# Patient Record
Sex: Female | Born: 1955 | Race: White | Hispanic: No | Marital: Married | Smoking: Former smoker
Health system: Southern US, Community
[De-identification: ages and names within clinical notes are randomized; demographics above are authoritative.]

## PROBLEM LIST (undated history)

## (undated) DIAGNOSIS — H269 Unspecified cataract: Secondary | ICD-10-CM

## (undated) DIAGNOSIS — E079 Disorder of thyroid, unspecified: Secondary | ICD-10-CM

## (undated) HISTORY — DX: Unspecified cataract: H26.9

## (undated) HISTORY — PX: COLONOSCOPY: SHX174

## (undated) HISTORY — DX: Disorder of thyroid, unspecified: E07.9

## (undated) HISTORY — PX: POLYPECTOMY: SHX149

---

## 1985-05-02 HISTORY — PX: OTHER SURGICAL HISTORY: SHX169

## 1985-06-15 HISTORY — PX: BREAST CYST EXCISION: SHX579

## 1989-05-02 HISTORY — PX: TUBAL LIGATION: SHX77

## 1993-05-02 HISTORY — PX: CHOLECYSTECTOMY: SHX55

## 1998-08-11 ENCOUNTER — Other Ambulatory Visit: Admission: RE | Admit: 1998-08-11 | Discharge: 1998-08-11 | Payer: Self-pay | Admitting: Obstetrics & Gynecology

## 1999-05-03 HISTORY — PX: LAPAROSCOPIC HYSTERECTOMY: SHX1926

## 1999-08-24 ENCOUNTER — Other Ambulatory Visit: Admission: RE | Admit: 1999-08-24 | Discharge: 1999-08-24 | Payer: Self-pay | Admitting: Obstetrics & Gynecology

## 2000-10-26 ENCOUNTER — Other Ambulatory Visit: Admission: RE | Admit: 2000-10-26 | Discharge: 2000-10-26 | Payer: Self-pay | Admitting: Obstetrics & Gynecology

## 2001-02-28 ENCOUNTER — Inpatient Hospital Stay (HOSPITAL_COMMUNITY): Admission: RE | Admit: 2001-02-28 | Discharge: 2001-03-02 | Payer: Self-pay | Admitting: Obstetrics & Gynecology

## 2013-06-13 ENCOUNTER — Encounter: Payer: Self-pay | Admitting: Internal Medicine

## 2013-07-30 ENCOUNTER — Ambulatory Visit (AMBULATORY_SURGERY_CENTER): Payer: Self-pay

## 2013-07-30 VITALS — Ht 63.0 in | Wt 203.2 lb

## 2013-07-30 DIAGNOSIS — Z1211 Encounter for screening for malignant neoplasm of colon: Secondary | ICD-10-CM

## 2013-07-30 MED ORDER — MOVIPREP 100 G PO SOLR: 1.0000 | Freq: Once | ORAL | Status: DC

## 2013-08-13 ENCOUNTER — Ambulatory Visit (AMBULATORY_SURGERY_CENTER): Payer: Commercial Managed Care - PPO | Admitting: Internal Medicine

## 2013-08-13 ENCOUNTER — Encounter: Payer: Self-pay | Admitting: Internal Medicine

## 2013-08-13 VITALS — BP 142/81 | HR 68 | Temp 97.6°F | Resp 16 | Ht 63.0 in | Wt 203.0 lb

## 2013-08-13 DIAGNOSIS — D126 Benign neoplasm of colon, unspecified: Secondary | ICD-10-CM

## 2013-08-13 DIAGNOSIS — Z1211 Encounter for screening for malignant neoplasm of colon: Secondary | ICD-10-CM

## 2013-08-13 MED ORDER — SODIUM CHLORIDE 0.9 % IV SOLN: 500.0000 mL | INTRAVENOUS | Status: DC

## 2013-08-13 NOTE — Patient Instructions (Signed)
Impressions/recommendations:  Polyp (handout given) High Fiber diet (handout given)  Repeat colonoscopy pending pathology results.  YOU HAD AN ENDOSCOPIC PROCEDURE TODAY AT THE [facility]: Refer to the procedure report that was given to you for any specific questions about what was found during the examination.  If the procedure report does not answer your questions, please call your gastroenterologist to clarify.  If you requested that your care partner not be given the details of your procedure findings, then the procedure report has been included in a sealed envelope for you to review at your convenience later.  YOU SHOULD EXPECT: Some feelings of bloating in the abdomen. Passage of more gas than usual.  Walking can help get rid of the air that was put into your GI tract during the procedure and reduce the bloating. If you had a lower endoscopy (such as a colonoscopy or flexible sigmoidoscopy) you may notice spotting of blood in your stool or on the toilet paper. If you underwent a bowel prep for your procedure, then you may not have a normal bowel movement for a few days.  DIET: Your first meal following the procedure should be a light meal and then it is ok to progress to your normal diet.  A half-sandwich or bowl of soup is an example of a good first meal.  Heavy or fried foods are harder to digest and may make you feel nauseous or bloated.  Likewise meals heavy in dairy and vegetables can cause extra gas to form and this can also increase the bloating.  Drink plenty of fluids but you should avoid alcoholic beverages for 24 hours.  ACTIVITY: Your care partner should take you home directly after the procedure.  You should plan to take it easy, moving slowly for the rest of the day.  You can resume normal activity the day after the procedure however you should NOT DRIVE or use heavy machinery for 24 hours (because of the sedation medicines used during the test).    SYMPTOMS TO REPORT  IMMEDIATELY: A gastroenterologist can be reached at any hour.  During normal business hours, 8:30 AM to 5:00 PM Monday through Friday, call [phone].  After hours and on weekends, please call the GI answering service at [phone] who will take a message and have the physician on call contact you.   Following lower endoscopy (colonoscopy or flexible sigmoidoscopy):  Excessive amounts of blood in the stool  Significant tenderness or worsening of abdominal pains  Swelling of the abdomen that is new, acute  Fever of 100F or higher   FOLLOW UP: If any biopsies were taken you will be contacted by phone or by letter within the next 1-3 weeks.  Call your gastroenterologist if you have not heard about the biopsies in 3 weeks.  Our staff will call the home number listed on your records the next business day following your procedure to check on you and address any questions or concerns that you may have at that time regarding the information given to you following your procedure. This is a courtesy call and so if there is no answer at the home number and we have not heard from you through the emergency physician on call, we will assume that you have returned to your regular daily activities without incident.  SIGNATURES/CONFIDENTIALITY: You and/or your care partner have signed paperwork which will be entered into your electronic medical record.  These signatures attest to the fact that that the information above on   After Visit Summary has been reviewed and is understood.  Full responsibility of the confidentiality of this discharge information lies with you and/or your care-partner.

## 2013-08-13 NOTE — Op Note (Addendum)
[place]  [address] [place], [id number]   COLONOSCOPY PROCEDURE REPORT  PATIENT: Tracey, Patel  MR#: [id number] BIRTHDATE: 08-04-1955 , 48  yrs. old GENDER: Female ENDOSCOPIST: Lafayette Dragon, MD REFERRED RF:FMBWGY Stann Mainland, M.D. PROCEDURE DATE:  08/13/2013 PROCEDURE:   Colonoscopy with cold biopsy polypectomy First Screening Colonoscopy - Avg.  risk and is 50 yrs.  old or older Yes.  Prior Negative Screening - Now for repeat screening. N/A  History of Adenoma - Now for follow-up colonoscopy & has been > or = to 3 yrs.  N/A  Polyps Removed Today? Yes. ASA CLASS:   Class I INDICATIONS:Average risk patient for colon cancer. MEDICATIONS: MAC sedation, administered by CRNA and Propofol (Diprivan) 280 mg IV  DESCRIPTION OF PROCEDURE:   After the risks benefits and alternatives of the procedure were thoroughly explained, informed consent was obtained.  A digital rectal exam revealed no abnormalities of the rectum.   The LB [id number]  endoscope was introduced through the anus and advanced to the cecum, which was identified by both the appendix and ileocecal valve. No adverse events experienced.   The quality of the prep was good, using MoviPrep  The instrument was then slowly withdrawn as the colon was fully examined.      COLON FINDINGS: A diminutive sessile polyp was found in the sigmoid colon.  A polypectomy was performed with cold forceps.  The resection was complete and the polyp tissue was completely retrieved.  Retroflexed views revealed no abnormalities. The time to cecum=3 minutes 34 seconds.  Withdrawal time=9 minutes 05 seconds.  The scope was withdrawn and the procedure completed. COMPLICATIONS: There were no complications.  ENDOSCOPIC IMPRESSION: Diminutive sessile polyp was found in the sigmoid colon; polypectomy was performed with cold forceps RECOMMENDATIONS: await pathology report High fiber diet Recall colonoscopy pending path  report  eSigned:  Lafayette Dragon, MD 08/13/2013 11:17 AM Revised: 08/13/2013 11:17 AM  cc:

## 2013-08-13 NOTE — Progress Notes (Signed)
Called to room to assist during endoscopic procedure.  Patient ID and intended procedure confirmed with present staff. Received instructions for my participation in the procedure from the performing physician.  

## 2013-08-13 NOTE — Progress Notes (Signed)
Report to pacu rn, vss, bbs=clear 

## 2013-08-14 ENCOUNTER — Telehealth: Payer: Self-pay | Admitting: *Deleted

## 2013-08-14 NOTE — Telephone Encounter (Signed)
  Follow up Call-  Call back number 08/13/2013  Post procedure Call Back phone  # [phone] hm  Permission to leave phone message No     Patient questions:  Do you have a fever, pain , or abdominal swelling? no Pain Score  0 *  Have you tolerated food without any problems? yes  Have you been able to return to your normal activities? yes  Do you have any questions about your discharge instructions: Diet   no Medications  no Follow up visit  no  Do you have questions or concerns about your Care? no  Actions: * If pain score is 4 or above: No action needed, pain <4.

## 2013-08-19 ENCOUNTER — Encounter: Payer: Self-pay | Admitting: Internal Medicine

## 2013-08-21 ENCOUNTER — Encounter: Payer: Self-pay | Admitting: *Deleted

## 2015-08-24 ENCOUNTER — Encounter: Payer: Self-pay | Admitting: Family Medicine

## 2016-05-03 LAB — HM MAMMOGRAPHY

## 2016-11-08 DIAGNOSIS — Z124 Encounter for screening for malignant neoplasm of cervix: Secondary | ICD-10-CM | POA: Insufficient documentation

## 2016-11-08 DIAGNOSIS — Z0000 Encounter for general adult medical examination without abnormal findings: Secondary | ICD-10-CM | POA: Insufficient documentation

## 2016-11-08 LAB — HEPATIC FUNCTION PANEL
ALK PHOS: 80 (ref 25–125)
ALT: 16 (ref 7–35)
AST: 19 (ref 13–35)
Bilirubin, Total: 0.7

## 2016-11-08 LAB — HEMOGLOBIN A1C: Hemoglobin A1C: 5.3

## 2016-11-08 LAB — BASIC METABOLIC PANEL
BUN: 13 (ref 4–21)
Creatinine: 1 (ref 0.5–1.1)
GLUCOSE: 95
Potassium: 4.9 (ref 3.4–5.3)
SODIUM: 139 (ref 137–147)

## 2016-11-08 LAB — CBC AND DIFFERENTIAL
HCT: 39 (ref 36–46)
HEMOGLOBIN: 13 (ref 12.0–16.0)
Neutrophils Absolute: 6
PLATELETS: 347 (ref 150–399)
WBC: 9.5

## 2016-11-08 LAB — LIPID PANEL
Cholesterol: 183 (ref 0–200)
HDL: 47 (ref 35–70)
Triglycerides: 160 (ref 40–160)

## 2016-11-08 LAB — TSH: TSH: 1.9 (ref 0.41–5.90)

## 2016-11-11 LAB — HM PAP SMEAR: HM PAP: NEGATIVE

## 2018-02-07 ENCOUNTER — Encounter: Payer: Self-pay | Admitting: Family Medicine

## 2018-02-07 ENCOUNTER — Ambulatory Visit: Payer: No Typology Code available for payment source | Admitting: Family Medicine

## 2018-02-07 VITALS — BP 130/72 | HR 70 | Temp 97.7°F | Ht 63.0 in | Wt 202.0 lb

## 2018-02-07 DIAGNOSIS — E039 Hypothyroidism, unspecified: Secondary | ICD-10-CM | POA: Diagnosis not present

## 2018-02-07 DIAGNOSIS — E669 Obesity, unspecified: Secondary | ICD-10-CM | POA: Diagnosis not present

## 2018-02-07 DIAGNOSIS — Z6835 Body mass index (BMI) 35.0-35.9, adult: Secondary | ICD-10-CM | POA: Diagnosis not present

## 2018-02-07 DIAGNOSIS — E6609 Other obesity due to excess calories: Secondary | ICD-10-CM | POA: Insufficient documentation

## 2018-02-07 NOTE — Patient Instructions (Addendum)
It was very nice to see you today!  You are doing an awesome job!  Keep up the good work!  No medication changes today.   I would like to get your records from your OBGYN.  I will see you back in 1 year, or sooner as needed.   Take care, Dr Jerline Pain   Preventive Care 40-64 Years, Female Preventive care refers to lifestyle choices and visits with your health care provider that can promote health and wellness. What does preventive care include?  A yearly physical exam. This is also called an annual well check.  Dental exams once or twice a year.  Routine eye exams. Ask your health care provider how often you should have your eyes checked.  Personal lifestyle choices, including: ? Daily care of your teeth and gums. ? Regular physical activity. ? Eating a healthy diet. ? Avoiding tobacco and drug use. ? Limiting alcohol use. ? Practicing safe sex. ? Taking low-dose aspirin daily starting at age 42. ? Taking vitamin and mineral supplements as recommended by your health care provider. What happens during an annual well check? The services and screenings done by your health care provider during your annual well check will depend on your age, overall health, lifestyle risk factors, and family history of disease. Counseling Your health care provider may ask you questions about your:  Alcohol use.  Tobacco use.  Drug use.  Emotional well-being.  Home and relationship well-being.  Sexual activity.  Eating habits.  Work and work Statistician.  Method of birth control.  Menstrual cycle.  Pregnancy history.  Screening You may have the following tests or measurements:  Height, weight, and BMI.  Blood pressure.  Lipid and cholesterol levels. These may be checked every 5 years, or more frequently if you are over 38 years old.  Skin check.  Lung cancer screening. You may have this screening every year starting at age 71 if you have a 30-pack-year history of smoking  and currently smoke or have quit within the past 15 years.  Fecal occult blood test (FOBT) of the stool. You may have this test every year starting at age 67.  Flexible sigmoidoscopy or colonoscopy. You may have a sigmoidoscopy every 5 years or a colonoscopy every 10 years starting at age 61.  Hepatitis C blood test.  Hepatitis B blood test.  Sexually transmitted disease (STD) testing.  Diabetes screening. This is done by checking your blood sugar (glucose) after you have not eaten for a while (fasting). You may have this done every 1-3 years.  Mammogram. This may be done every 1-2 years. Talk to your health care provider about when you should start having regular mammograms. This may depend on whether you have a family history of breast cancer.  BRCA-related cancer screening. This may be done if you have a family history of breast, ovarian, tubal, or peritoneal cancers.  Pelvic exam and Pap test. This may be done every 3 years starting at age 83. Starting at age 73, this may be done every 5 years if you have a Pap test in combination with an HPV test.  Bone density scan. This is done to screen for osteoporosis. You may have this scan if you are at high risk for osteoporosis.  Discuss your test results, treatment options, and if necessary, the need for more tests with your health care provider. Vaccines Your health care provider may recommend certain vaccines, such as:  Influenza vaccine. This is recommended every year.  Tetanus, diphtheria,  and acellular pertussis (Tdap, Td) vaccine. You may need a Td booster every 10 years.  Varicella vaccine. You may need this if you have not been vaccinated.  Zoster vaccine. You may need this after age 74.  Measles, mumps, and rubella (MMR) vaccine. You may need at least one dose of MMR if you were born in 1957 or later. You may also need a second dose.  Pneumococcal 13-valent conjugate (PCV13) vaccine. You may need this if you have certain  conditions and were not previously vaccinated.  Pneumococcal polysaccharide (PPSV23) vaccine. You may need one or two doses if you smoke cigarettes or if you have certain conditions.  Meningococcal vaccine. You may need this if you have certain conditions.  Hepatitis A vaccine. You may need this if you have certain conditions or if you travel or work in places where you may be exposed to hepatitis A.  Hepatitis B vaccine. You may need this if you have certain conditions or if you travel or work in places where you may be exposed to hepatitis B.  Haemophilus influenzae type b (Hib) vaccine. You may need this if you have certain conditions.  Talk to your health care provider about which screenings and vaccines you need and how often you need them. This information is not intended to replace advice given to you by your health care provider. Make sure you discuss any questions you have with your health care provider. Document Released: 05/15/2015 Document Revised: 01/06/2016 Document Reviewed: 02/17/2015 Elsevier Interactive Patient Education  2018 [place] Years, Female Preventive care refers to lifestyle choices and visits with your health care provider that can promote health and wellness. What does preventive care include?  A yearly physical exam. This is also called an annual well check.  Dental exams once or twice a year.  Routine eye exams. Ask your health care provider how often you should have your eyes checked.  Personal lifestyle choices, including: ? Daily care of your teeth and gums. ? Regular physical activity. ? Eating a healthy diet. ? Avoiding tobacco and drug use. ? Limiting alcohol use. ? Practicing safe sex. ? Taking low-dose aspirin daily starting at age 63. ? Taking vitamin and mineral supplements as recommended by your health care provider. What happens during an annual well check? The services and screenings done by your health  care provider during your annual well check will depend on your age, overall health, lifestyle risk factors, and family history of disease. Counseling Your health care provider may ask you questions about your:  Alcohol use.  Tobacco use.  Drug use.  Emotional well-being.  Home and relationship well-being.  Sexual activity.  Eating habits.  Work and work Statistician.  Method of birth control.  Menstrual cycle.  Pregnancy history.  Screening You may have the following tests or measurements:  Height, weight, and BMI.  Blood pressure.  Lipid and cholesterol levels. These may be checked every 5 years, or more frequently if you are over 15 years old.  Skin check.  Lung cancer screening. You may have this screening every year starting at age 76 if you have a 30-pack-year history of smoking and currently smoke or have quit within the past 15 years.  Fecal occult blood test (FOBT) of the stool. You may have this test every year starting at age 42.  Flexible sigmoidoscopy or colonoscopy. You may have a sigmoidoscopy every 5 years or a colonoscopy every 10 years starting at age 11.  Hepatitis C blood test.  Hepatitis B blood test.  Sexually transmitted disease (STD) testing.  Diabetes screening. This is done by checking your blood sugar (glucose) after you have not eaten for a while (fasting). You may have this done every 1-3 years.  Mammogram. This may be done every 1-2 years. Talk to your health care provider about when you should start having regular mammograms. This may depend on whether you have a family history of breast cancer.  BRCA-related cancer screening. This may be done if you have a family history of breast, ovarian, tubal, or peritoneal cancers.  Pelvic exam and Pap test. This may be done every 3 years starting at age 72. Starting at age 81, this may be done every 5 years if you have a Pap test in combination with an HPV test.  Bone density scan. This is  done to screen for osteoporosis. You may have this scan if you are at high risk for osteoporosis.  Discuss your test results, treatment options, and if necessary, the need for more tests with your health care provider. Vaccines Your health care provider may recommend certain vaccines, such as:  Influenza vaccine. This is recommended every year.  Tetanus, diphtheria, and acellular pertussis (Tdap, Td) vaccine. You may need a Td booster every 10 years.  Varicella vaccine. You may need this if you have not been vaccinated.  Zoster vaccine. You may need this after age 36.  Measles, mumps, and rubella (MMR) vaccine. You may need at least one dose of MMR if you were born in 1957 or later. You may also need a second dose.  Pneumococcal 13-valent conjugate (PCV13) vaccine. You may need this if you have certain conditions and were not previously vaccinated.  Pneumococcal polysaccharide (PPSV23) vaccine. You may need one or two doses if you smoke cigarettes or if you have certain conditions.  Meningococcal vaccine. You may need this if you have certain conditions.  Hepatitis A vaccine. You may need this if you have certain conditions or if you travel or work in places where you may be exposed to hepatitis A.  Hepatitis B vaccine. You may need this if you have certain conditions or if you travel or work in places where you may be exposed to hepatitis B.  Haemophilus influenzae type b (Hib) vaccine. You may need this if you have certain conditions.  Talk to your health care provider about which screenings and vaccines you need and how often you need them. This information is not intended to replace advice given to you by your health care provider. Make sure you discuss any questions you have with your health care provider. Document Released: 05/15/2015 Document Revised: 01/06/2016 Document Reviewed: 02/17/2015 Elsevier Interactive Patient Education  [employer].

## 2018-02-07 NOTE — Assessment & Plan Note (Signed)
Well-controlled on Synthroid 175 mcg daily.  Obtain records from her OB/GYN regarding most recent TSH.

## 2018-02-07 NOTE — Assessment & Plan Note (Signed)
BMI 35.8 today.  Discussed lifestyle modifications.  Congratulated patient on recent weight loss.  Follow-up in 1 year.

## 2018-02-07 NOTE — Progress Notes (Signed)
Subjective:  Tracey Patel is a 62 y.o. female who presents today with a chief complaint of hypothyroidism and to establish care.   HPI:  Hypothyroidism, chronic problem, new to provider Diagnosed with a teenager.  He has been on Synthroid for several years.  She is currently on 175 mcg daily and tolerating well.  No skin or hair changes.  No constipation or diarrhea.  Synthroid helps with her symptoms. Her symptoms are stable on her current dose of synthroid.  Obesity, chronic problem, new to provider Several year history.  She has made several dietary modifications lately and has lost about 12 pounds in the past 6 weeks.  She likes to walk.  Her weight is improving.  ROS: Per HPI, otherwise a complete review of systems was negative.   PMH:  The following were reviewed and entered/updated in epic: Past Medical History:  Diagnosis Date  . Thyroid disease    hypothyroidism   Patient Active Problem List   Diagnosis Date Noted  . Hypothyroidism 02/07/2018  . Class 2 obesity without serious comorbidity with body mass index (BMI) of 35.0 to 35.9 in adult 02/07/2018   Past Surgical History:  Procedure Laterality Date  . breast cyst removal  1987  . CHOLECYSTECTOMY  1995  . LAPAROSCOPIC HYSTERECTOMY  2001  . TUBAL LIGATION  1991    Family History  Problem Relation Age of Onset  . Diabetes Mother   . Atrial fibrillation Mother   . Congestive Heart Failure Mother   . Obesity Mother   . High blood pressure Father   . Atrial fibrillation Father   . Gout Father   . Barrett's esophagus Father   . Esophageal cancer Maternal Grandfather   . Lung cancer Maternal Grandfather   . Uterine cancer Paternal Grandmother   . Colon cancer Neg Hx   . Pancreatic cancer Neg Hx   . Rectal cancer Neg Hx   . Stomach cancer Neg Hx     Medications- reviewed and updated Current Outpatient Medications  Medication Sig Dispense Refill  . levothyroxine (SYNTHROID, LEVOTHROID) 175 MCG tablet  Take 175 mcg by mouth daily before breakfast.      No current facility-administered medications for this visit.     Allergies-reviewed and updated No Known Allergies  Social History   Socioeconomic History  . Marital status: Married    Spouse name: Not on file  . Number of children: Not on file  . Years of education: Not on file  . Highest education level: Not on file  Occupational History  . Not on file  Social Needs  . Financial resource strain: Not on file  . Food insecurity:    Worry: Not on file    Inability: Not on file  . Transportation needs:    Medical: Not on file    Non-medical: Not on file  Tobacco Use  . Smoking status: Former Smoker    Types: Cigarettes  . Smokeless tobacco: Never Used  . Tobacco comment: Smoked less than pack for 2 years  Substance and Sexual Activity  . Alcohol use: Yes    Alcohol/week: 4.0 - 5.0 standard drinks    Types: 4 - 5 Glasses of wine per week  . Drug use: No  . Sexual activity: Yes    Partners: Male  Lifestyle  . Physical activity:    Days per week: Not on file    Minutes per session: Not on file  . Stress: Not on file  Relationships  .  Social connections:    Talks on phone: Not on file    Gets together: Not on file    Attends religious service: Not on file    Active member of club or organization: Not on file    Attends meetings of clubs or organizations: Not on file    Relationship status: Not on file  Other Topics Concern  . Not on file  Social History Narrative  . Not on file    Objective:  Physical Exam: BP 130/72 (BP Location: Left Arm, Patient Position: Sitting, Cuff Size: Normal)   Pulse 70   Temp 97.7 F (36.5 C) (Oral)   Ht 5\' 3"  (1.6 m)   Wt 202 lb (91.6 kg)   SpO2 98%   BMI 35.78 kg/m   Gen: NAD, resting comfortably CV: RRR with no murmurs appreciated Pulm: NWOB, CTAB with no crackles, wheezes, or rhonchi GI: Obese, normal bowel sounds present. Soft, Nontender, Nondistended. MSK: No edema,  cyanosis, or clubbing noted Skin: Warm, dry Neuro: Grossly normal, moves all extremities Psych: Normal affect and thought content  Assessment/Plan:  Hypothyroidism Well-controlled on Synthroid 175 mcg daily.  Obtain records from her OB/GYN regarding most recent TSH.  Class 2 obesity without serious comorbidity with body mass index (BMI) of 35.0 to 35.9 in adult BMI 35.8 today.  Discussed lifestyle modifications.  Congratulated patient on recent weight loss.  Follow-up in 1 year.  Preventative Healthcare Obtain records from her OBGYN regarding recent mammogram, Pap smear, and blood work.  Algis Greenhouse. Jerline Pain, MD 02/07/2018 10:02 AM

## 2018-02-09 ENCOUNTER — Encounter: Payer: Self-pay | Admitting: Physical Therapy

## 2018-08-14 ENCOUNTER — Encounter: Payer: Self-pay | Admitting: Gastroenterology

## 2018-12-19 DIAGNOSIS — N951 Menopausal and female climacteric states: Secondary | ICD-10-CM | POA: Insufficient documentation

## 2018-12-20 ENCOUNTER — Ambulatory Visit (INDEPENDENT_AMBULATORY_CARE_PROVIDER_SITE_OTHER): Payer: No Typology Code available for payment source | Admitting: Sports Medicine

## 2018-12-20 ENCOUNTER — Other Ambulatory Visit: Payer: Self-pay | Admitting: Sports Medicine

## 2018-12-20 ENCOUNTER — Other Ambulatory Visit: Payer: Self-pay

## 2018-12-20 ENCOUNTER — Ambulatory Visit (INDEPENDENT_AMBULATORY_CARE_PROVIDER_SITE_OTHER): Payer: No Typology Code available for payment source

## 2018-12-20 ENCOUNTER — Encounter: Payer: Self-pay | Admitting: Sports Medicine

## 2018-12-20 VITALS — Temp 96.9°F | Resp 16

## 2018-12-20 DIAGNOSIS — M79672 Pain in left foot: Secondary | ICD-10-CM

## 2018-12-20 DIAGNOSIS — M722 Plantar fascial fibromatosis: Secondary | ICD-10-CM

## 2018-12-20 DIAGNOSIS — M7732 Calcaneal spur, left foot: Secondary | ICD-10-CM | POA: Diagnosis not present

## 2018-12-20 MED ORDER — TRIAMCINOLONE ACETONIDE 10 MG/ML IJ SUSP
10.0000 mg | Freq: Once | INTRAMUSCULAR | Status: AC
  Administered 2018-12-20: 10 mg

## 2018-12-20 MED ORDER — MELOXICAM 15 MG PO TABS: 15.0000 mg | ORAL_TABLET | Freq: Every day | ORAL | 0 refills | Status: DC

## 2018-12-20 NOTE — Patient Instructions (Signed)

## 2018-12-20 NOTE — Progress Notes (Signed)
Subjective: Tracey Patel is a 63 y.o. female patient presents to office with complaint of moderate heel pain on the left. Patient admits to post static dyskinesia for 1 month in duration.  Pain is worse when getting out of bed in the morning.  Patient ranks pain 8-9 out of 10 at the bottom of the heel with a low-grade stabbing that after she gets going rest throughout the day that is 3 out of 10.  Patient has treated this problem with motrin and icing with no relief.  Denies swelling, warmth, redness.  Denies any other pedal complaints.   Review of Systems  All other systems reviewed and are negative.    Patient Active Problem List   Diagnosis Date Noted  . Menopausal symptom 12/19/2018  . Hypothyroidism 02/07/2018  . Class 2 obesity without serious comorbidity with body mass index (BMI) of 35.0 to 35.9 in adult 02/07/2018  . Screening for malignant neoplasm of cervix 11/08/2016    Current Outpatient Medications on File Prior to Visit  Medication Sig Dispense Refill  . levothyroxine (SYNTHROID, LEVOTHROID) 175 MCG tablet Take 175 mcg by mouth daily before breakfast.      No current facility-administered medications on file prior to visit.     No Known Allergies  Objective: Physical Exam General: The patient is alert and oriented x3 in no acute distress.  Dermatology: Skin is warm, dry and supple bilateral lower extremities. Nails 1-10 are normal. There is no erythema, edema, no eccymosis, no open lesions present. Integument is otherwise unremarkable.  Vascular: Dorsalis Pedis pulse and Posterior Tibial pulse are 2/4 bilateral. Capillary fill time is immediate to all digits.  Neurological: Grossly intact to light touch with an achilles reflex of +2/5 and a  negative Tinel's sign bilateral.  Musculoskeletal: Tenderness to palpation at the medial calcaneal tubercale and through the insertion of the plantar fascia on the left foot. No pain with compression of calcaneus bilateral. No  pain with tuning fork to calcaneus bilateral. No pain with calf compression bilateral. There is decreased Ankle joint range of motion bilateral. All other joints range of motion within normal limits bilateral. Strength 5/5 in all groups bilateral.   Gait: Unassisted, Antalgic avoid weight on left heel  Xray,Left foot:  Normal osseous mineralization. Joint spaces preserved. No fracture/dislocation/boney destruction. Calcaneal spur present with mild thickening of plantar fascia. No other soft tissue abnormalities or radiopaque foreign bodies.   Assessment and Plan: Problem List Items Addressed This Visit    None    Visit Diagnoses    Plantar fasciitis of left foot    -  Primary   Relevant Medications   triamcinolone acetonide (KENALOG) 10 MG/ML injection 10 mg (Completed) (Start on 12/20/2018  9:30 AM)   meloxicam (MOBIC) 15 MG tablet   Heel spur, left       Inflammatory pain of left heel          -Complete examination performed.  -Xrays reviewed -Discussed with patient in detail the condition of plantar fasciitis, how this occurs and general treatment options. Explained both conservative and surgical treatments.  -After oral consent and aseptic prep, injected a mixture containing 1 ml of 2% plain lidocaine, 1 ml 0.5% plain marcaine, 0.5 ml of kenalog 10 and 0.5 ml of dexamethasone phosphate into left heel. Post-injection care discussed with patient.  -Rx Meloxicam  -Recommended good supportive shoes - Explained in detail the use of the fascial brace for the left which was dispensed at today's visit. -Explained and  dispensed to patient daily stretching exercises. -Recommend patient to ice affected area 1-2x daily. -Patient to return to office in 3-4 weeks for follow up or sooner if problems or questions arise.  Landis Martins, DPM

## 2018-12-21 ENCOUNTER — Ambulatory Visit: Payer: Self-pay | Admitting: Family Medicine

## 2018-12-21 ENCOUNTER — Telehealth: Payer: No Typology Code available for payment source | Admitting: Family Medicine

## 2018-12-31 ENCOUNTER — Other Ambulatory Visit: Payer: Self-pay | Admitting: Sports Medicine

## 2018-12-31 DIAGNOSIS — M79672 Pain in left foot: Secondary | ICD-10-CM

## 2018-12-31 DIAGNOSIS — M722 Plantar fascial fibromatosis: Secondary | ICD-10-CM

## 2019-01-17 ENCOUNTER — Encounter: Payer: Self-pay | Admitting: Sports Medicine

## 2019-01-17 ENCOUNTER — Ambulatory Visit (INDEPENDENT_AMBULATORY_CARE_PROVIDER_SITE_OTHER): Payer: No Typology Code available for payment source | Admitting: Sports Medicine

## 2019-01-17 ENCOUNTER — Other Ambulatory Visit: Payer: Self-pay

## 2019-01-17 DIAGNOSIS — M722 Plantar fascial fibromatosis: Secondary | ICD-10-CM

## 2019-01-17 DIAGNOSIS — M79672 Pain in left foot: Secondary | ICD-10-CM

## 2019-01-17 DIAGNOSIS — M7732 Calcaneal spur, left foot: Secondary | ICD-10-CM

## 2019-01-17 NOTE — Progress Notes (Signed)
Subjective: Tracey Patel is a 63 y.o. female returns to office for follow up evaluation after Left heel injection for plantar fasciitis, injection #1 administered 3 weeks ago. Patient states that the injection seems to help her pain; feels 95% better. Patient denies any recent changes in medications or new problems since last visit.   Patient Active Problem List   Diagnosis Date Noted  . Menopausal symptom 12/19/2018  . Hypothyroidism 02/07/2018  . Class 2 obesity without serious comorbidity with body mass index (BMI) of 35.0 to 35.9 in adult 02/07/2018  . Screening for malignant neoplasm of cervix 11/08/2016    Current Outpatient Medications on File Prior to Visit  Medication Sig Dispense Refill  . levothyroxine (SYNTHROID, LEVOTHROID) 175 MCG tablet Take 175 mcg by mouth daily before breakfast.     . meloxicam (MOBIC) 15 MG tablet Take 1 tablet (15 mg total) by mouth daily. 30 tablet 0   No current facility-administered medications on file prior to visit.     No Known Allergies  Objective:   General:  Alert and oriented x 3, in no acute distress  Dermatology: Skin is warm, dry, and supple bilateral. Nails are within normal limits. There is no lower extremity erythema, no eccymosis, no open lesions present bilateral.   Vascular: Dorsalis Pedis and Posterior Tibial pedal pulses are 2/4 bilateral. + hair growth noted bilateral. Capillary Fill Time is 3 seconds in all digits. No varicosities, No edema bilateral lower extremities.   Neurological: Sensation grossly intact to light touch with an achilles reflex of +2 and a  negative Tinel's sign bilateral. Vibratory, sharp/dull, Semmes Weinstein Monofilament within normal limits.   Musculoskeletal: There is no reproducible tenderness to palpation at the medial calcaneal tubercale and through the insertion of the plantar fascia on the Left foot. No pain with compression to calcaneus or application of tuning fork. There is decreased Ankle  joint range of motion bilateral. All other jointsrange of motion  within normal limits bilateral. Strength 5/5 bilateral.   Assessment and Plan: Problem List Items Addressed This Visit    None    Visit Diagnoses    Plantar fasciitis of left foot    -  Primary   Heel spur, left       Inflammatory pain of left heel         -Complete examination performed.  -Previous x-rays reviewed. -Re-discussed with patient in detail the condition of plantar fasciitis, how this  occurs related to the foot type of the patient and general treatment options. -Continue with fascial brace for 1 more week  -Continue with Mobic until completed -Continue with stretching, icing, good supportive shoes daily  -Discussed long term care and reocurrence; will closely monitor; if fails to improve will consider other treatment modalities.  -Patient to return to office PRN or sooner if problems or questions arise.  Landis Martins, DPM

## 2019-07-22 LAB — CBC AND DIFFERENTIAL
HCT: 39 (ref 36–46)
Hemoglobin: 13.5 (ref 12.0–16.0)
Neutrophils Absolute: 51
Platelets: 307 (ref 150–399)
WBC: 9.3

## 2019-07-22 LAB — BASIC METABOLIC PANEL
BUN: 14 (ref 4–21)
CO2: 21 (ref 13–22)
Chloride: 102 (ref 99–108)
Creatinine: 0.9 (ref 0.5–1.1)
Glucose: 102
Potassium: 4.7 (ref 3.4–5.3)
Sodium: 141 (ref 137–147)

## 2019-07-22 LAB — LIPID PANEL
Cholesterol: 194 (ref 0–200)
HDL: 39 (ref 35–70)
LDL Cholesterol: 121
Triglycerides: 192 — AB (ref 40–160)

## 2019-07-22 LAB — COMPREHENSIVE METABOLIC PANEL
Albumin: 4.6 (ref 3.5–5.0)
Calcium: 9.9 (ref 8.7–10.7)
GFR calc Af Amer: 83
GFR calc non Af Amer: 72
Globulin: 2.5

## 2019-07-22 LAB — HEPATIC FUNCTION PANEL
ALT: 28 (ref 7–35)
AST: 22 (ref 13–35)
Alkaline Phosphatase: 104 (ref 25–125)
Bilirubin, Total: 0.6

## 2019-07-22 LAB — TSH: TSH: 3.27 (ref 0.41–5.90)

## 2019-07-22 LAB — CBC: RBC: 4.28 (ref 3.87–5.11)

## 2019-07-22 LAB — HEMOGLOBIN A1C: Hemoglobin A1C: 5.6

## 2019-12-10 ENCOUNTER — Telehealth: Payer: Self-pay | Admitting: Gastroenterology

## 2019-12-10 NOTE — Telephone Encounter (Signed)
Spoke to patient who had a few questions regarding her colonoscopy. Patient is scheduled for pre visit and colonoscopy next month all questions answered. Patient voiced understanding.

## 2019-12-10 NOTE — Telephone Encounter (Signed)
Patient calling to confirm that she is due for a colonoscopy recall is overdue.

## 2020-01-23 ENCOUNTER — Ambulatory Visit (AMBULATORY_SURGERY_CENTER): Payer: Self-pay | Admitting: *Deleted

## 2020-01-23 ENCOUNTER — Other Ambulatory Visit: Payer: Self-pay

## 2020-01-23 VITALS — Ht 63.0 in | Wt 220.0 lb

## 2020-01-23 DIAGNOSIS — Z86010 Personal history of colonic polyps: Secondary | ICD-10-CM

## 2020-01-23 MED ORDER — SUTAB 1479-225-188 MG PO TABS: 24.0000 | ORAL_TABLET | ORAL | 0 refills | Status: DC

## 2020-01-23 NOTE — Progress Notes (Signed)

## 2020-01-24 ENCOUNTER — Encounter: Payer: Self-pay | Admitting: Gastroenterology

## 2020-01-28 ENCOUNTER — Other Ambulatory Visit: Payer: Self-pay

## 2020-01-28 ENCOUNTER — Ambulatory Visit (INDEPENDENT_AMBULATORY_CARE_PROVIDER_SITE_OTHER): Payer: PRIVATE HEALTH INSURANCE | Admitting: Family Medicine

## 2020-01-28 ENCOUNTER — Encounter: Payer: Self-pay | Admitting: Family Medicine

## 2020-01-28 VITALS — BP 133/79 | HR 94 | Temp 98.2°F | Ht 63.0 in | Wt 219.0 lb

## 2020-01-28 DIAGNOSIS — F439 Reaction to severe stress, unspecified: Secondary | ICD-10-CM | POA: Diagnosis not present

## 2020-01-28 DIAGNOSIS — E669 Obesity, unspecified: Secondary | ICD-10-CM | POA: Diagnosis not present

## 2020-01-28 DIAGNOSIS — E039 Hypothyroidism, unspecified: Secondary | ICD-10-CM | POA: Diagnosis not present

## 2020-01-28 NOTE — Assessment & Plan Note (Signed)
Last TSH at goal.  Continue Synthroid 175 mcg daily.

## 2020-01-28 NOTE — Patient Instructions (Signed)
It was very nice to see you today!  I will place a referral for you to see a nutritionist.  Please let me know if you would like to try Ozempic.  Take care, Dr Jerline Pain  Please try these tips to maintain a healthy lifestyle:   Eat at least 3 REAL meals and 1-2 snacks per day.  Aim for no more than 5 hours between eating.  If you eat breakfast, please do so within one hour of getting up.    Each meal should contain half fruits/vegetables, one quarter protein, and one quarter carbs (no bigger than a computer mouse)   Cut down on sweet beverages. This includes juice, soda, and sweet tea.     Drink at least 1 glass of water with each meal and aim for at least 8 glasses per day   Exercise at least 150 minutes every week.

## 2020-01-28 NOTE — Assessment & Plan Note (Signed)
Discussed diet and exercise.  Also discussed pharmacotherapy.  She will look into starting Ozempic.  Will place referral to nutritionist.

## 2020-01-28 NOTE — Assessment & Plan Note (Signed)
Has been under increased stress lately due to home situation.  Overall thinks things are manageable.  Does not want medication at this point.  Gave behavioral health handout with contact information for therapy.

## 2020-01-28 NOTE — Progress Notes (Signed)
   Tracey Patel is a 64 y.o. female who presents today for an office visit.  Assessment/Plan:  Chronic Problems Addressed Today: Stress Has been under increased stress lately due to home situation.  Overall thinks things are manageable.  Does not want medication at this point.  Gave behavioral health handout with contact information for therapy.  Obesity Discussed diet and exercise.  Also discussed pharmacotherapy.  She will look into starting Ozempic.  Will place referral to nutritionist.  Hypothyroidism Last TSH at goal.  Continue Synthroid 175 mcg daily.     Subjective:  HPI:  See A/p.        Objective:  Physical Exam: BP 133/79   Pulse 94   Temp 98.2 F (36.8 C) (Temporal)   Ht 5\' 3"  (1.6 m)   Wt 219 lb (99.3 kg)   SpO2 99%   BMI 38.79 kg/m   Wt Readings from Last 3 Encounters:  01/28/20 219 lb (99.3 kg)  01/23/20 220 lb (99.8 kg)  02/07/18 202 lb (91.6 kg)    Gen: No acute distress, resting comfortably CV: Regular rate and rhythm with no murmurs appreciated Pulm: Normal work of breathing, clear to auscultation bilaterally with no crackles, wheezes, or rhonchi Neuro: Grossly normal, moves all extremities Psych: Normal affect and thought content      Vanda Waskey M. Jerline Pain, MD 01/28/2020 3:47 PM

## 2020-01-31 ENCOUNTER — Encounter: Payer: Self-pay | Admitting: Family Medicine

## 2020-02-06 ENCOUNTER — Encounter: Payer: Self-pay | Admitting: Gastroenterology

## 2020-02-06 ENCOUNTER — Other Ambulatory Visit: Payer: Self-pay | Admitting: Gastroenterology

## 2020-02-06 ENCOUNTER — Other Ambulatory Visit: Payer: Self-pay

## 2020-02-06 ENCOUNTER — Ambulatory Visit (AMBULATORY_SURGERY_CENTER): Payer: PRIVATE HEALTH INSURANCE | Admitting: Gastroenterology

## 2020-02-06 VITALS — BP 139/73 | HR 78 | Temp 97.3°F | Resp 17 | Ht 63.0 in | Wt 220.0 lb

## 2020-02-06 DIAGNOSIS — Z86010 Personal history of colonic polyps: Secondary | ICD-10-CM

## 2020-02-06 DIAGNOSIS — D125 Benign neoplasm of sigmoid colon: Secondary | ICD-10-CM

## 2020-02-06 DIAGNOSIS — K640 First degree hemorrhoids: Secondary | ICD-10-CM

## 2020-02-06 MED ORDER — SODIUM CHLORIDE 0.9 % IV SOLN: 500.0000 mL | INTRAVENOUS | Status: DC

## 2020-02-06 NOTE — Progress Notes (Signed)
Called to room to assist during endoscopic procedure.  Patient ID and intended procedure confirmed with present staff. Received instructions for my participation in the procedure from the performing physician.  

## 2020-02-06 NOTE — Patient Instructions (Addendum)
Handouts given for high fiber diet, hemorrhoids, polyps and hemorrhoid banding.  YOU HAD AN ENDOSCOPIC PROCEDURE TODAY AT [facility]:   Refer to the procedure report that was given to you for any specific questions about what was found during the examination.  If the procedure report does not answer your questions, please call your gastroenterologist to clarify.  If you requested that your care partner not be given the details of your procedure findings, then the procedure report has been included in a sealed envelope for you to review at your convenience later.  YOU SHOULD EXPECT: Some feelings of bloating in the abdomen. Passage of more gas than usual.  Walking can help get rid of the air that was put into your GI tract during the procedure and reduce the bloating. If you had a lower endoscopy (such as a colonoscopy or flexible sigmoidoscopy) you may notice spotting of blood in your stool or on the toilet paper. If you underwent a bowel prep for your procedure, you may not have a normal bowel movement for a few days.  Please Note:  You might notice some irritation and congestion in your nose or some drainage.  This is from the oxygen used during your procedure.  There is no need for concern and it should clear up in a day or so.  SYMPTOMS TO REPORT IMMEDIATELY:   Following lower endoscopy (colonoscopy or flexible sigmoidoscopy):  Excessive amounts of blood in the stool  Significant tenderness or worsening of abdominal pains  Swelling of the abdomen that is new, acute  Fever of 100F or higher  For urgent or emergent issues, a gastroenterologist can be reached at any hour by calling [phone]. Do not use MyChart messaging for urgent concerns.    DIET:  We do recommend a small meal at first, but then you may proceed to your regular diet.  Drink plenty of fluids but you should avoid alcoholic beverages for 24 hours.  ACTIVITY:  You should plan to take it easy for the rest  of today and you should NOT DRIVE or use heavy machinery until tomorrow (because of the sedation medicines used during the test).    FOLLOW UP: Our staff will call the number listed on your records 48-72 hours following your procedure to check on you and address any questions or concerns that you may have regarding the information given to you following your procedure. If we do not reach you, we will leave a message.  We will attempt to reach you two times.  During this call, we will ask if you have developed any symptoms of COVID 19. If you develop any symptoms (ie: fever, flu-like symptoms, shortness of breath, cough etc.) before then, please call [phone].  If you test positive for Covid 19 in the 2 weeks post procedure, please call and report this information to [place].    If any biopsies were taken you will be contacted by phone or by letter within the next 1-3 weeks.  Please call us at [phone] if you have not heard about the biopsies in 3 weeks.    SIGNATURES/CONFIDENTIALITY: You and/or your care partner have signed paperwork which will be entered into your electronic medical record.  These signatures attest to the fact that that the information above on your After Visit Summary has been reviewed and is understood.  Full responsibility of the confidentiality of this discharge information lies with you and/or your care-partner.

## 2020-02-06 NOTE — Progress Notes (Signed)
To PACU, VSS. Report to [email or web address] 

## 2020-02-06 NOTE — Progress Notes (Signed)
Pt's states no medical or surgical changes since previsit or office visit. 

## 2020-02-06 NOTE — Op Note (Signed)
[facility] Patient Name: Tracey Patel Procedure Date: 02/06/2020 8:00 AM MRN: [id number] Endoscopist: Gerrit Heck , MD Age: 64 Referring MD:  Date of Birth: 1955/09/18 Gender: Female Account #: [id number] Procedure:                Colonoscopy Indications:              Surveillance: Personal history of adenomatous                            polyps on last colonoscopy > 5 years ago                           Colonoscopy in 07/2013 with a single subcentimeter                            tubular adenoma, with recommendation to repeat in 5                            years. Medicines:                Monitored Anesthesia Care Procedure:                Pre-Anesthesia Assessment:                           - Prior to the procedure, a History and Physical                            was performed, and patient medications and                            allergies were reviewed. The patient's tolerance of                            previous anesthesia was also reviewed. The risks                            and benefits of the procedure and the sedation                            options and risks were discussed with the patient.                            All questions were answered, and informed consent                            was obtained. Prior Anticoagulants: The patient has                            taken no previous anticoagulant or antiplatelet                            agents. ASA Grade Assessment: II - A patient with  mild systemic disease. After reviewing the risks                            and benefits, the patient was deemed in                            satisfactory condition to undergo the procedure.                           After obtaining informed consent, the colonoscope                            was passed under direct vision. Throughout the                            procedure, the patient's blood pressure, pulse, and                             oxygen saturations were monitored continuously. The                            Colonoscope was introduced through the anus and                            advanced to the the terminal ileum. The colonoscopy                            was performed without difficulty. The patient                            tolerated the procedure well. The quality of the                            bowel preparation was excellent. The terminal                            ileum, ileocecal valve, appendiceal orifice, and                            rectum were photographed. Scope In: 8:04:01 AM Scope Out: 8:19:26 AM Scope Withdrawal Time: 0 hours 10 minutes 24 seconds  Total Procedure Duration: 0 hours 15 minutes 25 seconds  Findings:                 The perianal and digital rectal examinations were                            normal.                           A 5 mm polyp was found in the sigmoid colon. The                            polyp was sessile. The polyp was removed with a  cold snare. Resection and retrieval were complete.                            Estimated blood loss was minimal.                           Non-bleeding internal hemorrhoids were found during                            retroflexion. The hemorrhoids were small and Grade                            I (internal hemorrhoids that do not prolapse).                           The exam was otherwise normal throughout the                            remainder of the colon.                           The terminal ileum appeared normal. Complications:            No immediate complications. Estimated Blood Loss:     Estimated blood loss was minimal. Impression:               - One 5 mm polyp in the sigmoid colon, removed with                            a cold snare. Resected and retrieved.                           - Non-bleeding internal hemorrhoids.                           - The examined portion of the ileum was  normal. Recommendation:           - Patient has a contact number available for                            emergencies. The signs and symptoms of potential                            delayed complications were discussed with the                            patient. Return to normal activities tomorrow.                            Written discharge instructions were provided to the                            patient.                           - Resume previous diet.                           -  Continue present medications.                           - Await pathology results.                           - Repeat colonoscopy for surveillance based on                            pathology results.                           - Return to GI office PRN.                           - Use fiber, for example Citrucel, Fibercon, Konsyl                            or Metamucil.                           - Internal hemorrhoids were noted on this study and                            may be amenable to hemorrhoid band ligation. If you                            are interested in further treatment of these                            hemorrhoids with band ligation, please contact my                            clinic to set up an appointment for evaluation and                            treatment. Gerrit Heck, MD 02/06/2020 8:29:33 AM

## 2020-02-10 ENCOUNTER — Telehealth: Payer: Self-pay | Admitting: *Deleted

## 2020-02-10 NOTE — Telephone Encounter (Signed)
°  Follow up Call-  Call back number 02/06/2020  Post procedure Call Back phone  # [phone]  Permission to leave phone message Yes  Some recent data might be hidden     Patient questions:  Do you have a fever, pain , or abdominal swelling? No. Pain Score  0 *  Have you tolerated food without any problems? Yes.    Have you been able to return to your normal activities? Yes.    Do you have any questions about your discharge instructions: Diet   No. Medications  No. Follow up visit  No.  Do you have questions or concerns about your Care? No.  Actions: * If pain score is 4 or above: No action needed, pain <4. 1. Have you developed a fever since your procedure? no  2.   Have you had an respiratory symptoms (SOB or cough) since your procedure? no  3.   Have you tested positive for COVID 19 since your procedure no  4.   Have you had any family members/close contacts diagnosed with the COVID 19 since your procedure?  no   If yes to any of these questions please route to Joylene John, RN and Joella Prince, RN

## 2020-02-11 ENCOUNTER — Encounter: Payer: Self-pay | Admitting: Family Medicine

## 2020-02-12 ENCOUNTER — Encounter: Payer: Self-pay | Admitting: Gastroenterology

## 2020-02-27 ENCOUNTER — Ambulatory Visit: Payer: PRIVATE HEALTH INSURANCE | Admitting: Dietician

## 2020-12-31 HISTORY — PX: COLONOSCOPY: SHX174

## 2021-02-01 ENCOUNTER — Other Ambulatory Visit: Payer: Self-pay | Admitting: Obstetrics & Gynecology

## 2021-02-01 DIAGNOSIS — Z1231 Encounter for screening mammogram for malignant neoplasm of breast: Secondary | ICD-10-CM

## 2021-02-24 ENCOUNTER — Ambulatory Visit
Admission: RE | Admit: 2021-02-24 | Discharge: 2021-02-24 | Disposition: A | Discharge status: Home / Self Care | Payer: Self-pay | Source: Ambulatory Visit | Attending: Obstetrics & Gynecology | Admitting: Obstetrics & Gynecology

## 2021-02-24 ENCOUNTER — Other Ambulatory Visit: Payer: Self-pay

## 2021-02-24 DIAGNOSIS — Z1231 Encounter for screening mammogram for malignant neoplasm of breast: Secondary | ICD-10-CM

## 2021-03-23 ENCOUNTER — Telehealth: Payer: Self-pay | Admitting: Sports Medicine

## 2021-03-23 NOTE — Telephone Encounter (Signed)
Pt left message stating she has seen [place] a few years ago for Plantar fascitis and now she is having trouble with the other foot. She has some questions about possibly getting orthotics.  I returned call and left message that pt will probably need an appt and to call me back and we can discuss further.

## 2021-03-24 NOTE — Telephone Encounter (Signed)
Pt called back and her insurance is for catastrophic coverage only so she would be self pay. She did not want an appt to see the doctor. She is aware that as of now there is no appts with the orthotics dept in [place] office and if she wanted to proceed she will call me to get scheduled to possibly come to [facility] office. She is aware of the 438.00 cost and I also gave her the information that if she were to get a second pair within 6 months of ordering them 1st pair they are half price.

## 2021-03-24 NOTE — Telephone Encounter (Signed)
Pt returned call yesterday afternoon.  I returned call and left message for pt to call to schedule an appt in [place] to see Dr Cannon Kettle for orthotics and foot pain.

## 2021-06-02 ENCOUNTER — Encounter: Payer: Self-pay | Admitting: Sports Medicine

## 2021-06-02 ENCOUNTER — Ambulatory Visit (INDEPENDENT_AMBULATORY_CARE_PROVIDER_SITE_OTHER): Payer: No Typology Code available for payment source

## 2021-06-02 ENCOUNTER — Ambulatory Visit (INDEPENDENT_AMBULATORY_CARE_PROVIDER_SITE_OTHER): Payer: No Typology Code available for payment source | Admitting: Sports Medicine

## 2021-06-02 DIAGNOSIS — M79671 Pain in right foot: Secondary | ICD-10-CM

## 2021-06-02 DIAGNOSIS — M216X1 Other acquired deformities of right foot: Secondary | ICD-10-CM | POA: Diagnosis not present

## 2021-06-02 DIAGNOSIS — M722 Plantar fascial fibromatosis: Secondary | ICD-10-CM

## 2021-06-02 MED ORDER — MELOXICAM 15 MG PO TABS: 15.0000 mg | ORAL_TABLET | Freq: Every day | ORAL | 0 refills | Status: DC

## 2021-06-02 MED ORDER — TRIAMCINOLONE ACETONIDE 10 MG/ML IJ SUSP
10.0000 mg | Freq: Once | INTRAMUSCULAR | Status: AC
  Administered 2021-06-02: 10 mg

## 2021-06-02 NOTE — Progress Notes (Signed)
Subjective: Tracey Patel is a 66 y.o. female patient presents to office with complaint of moderate heel pain on the right for the last 6 months.  Patient reports that the brace helps but still has significant pain with first few steps in the morning and when she gets up from sitting states that she has been consistent with stretching icing refraining from walking barefooted and sometimes using heat as well for the pain. Denies any other pedal complaints.   Patient Active Problem List   Diagnosis Date Noted   Stress 01/28/2020   Menopausal symptom 12/19/2018   Hypothyroidism 02/07/2018   Obesity 02/07/2018    Current Outpatient Medications on File Prior to Visit  Medication Sig Dispense Refill   levothyroxine (SYNTHROID, LEVOTHROID) 175 MCG tablet Take 175 mcg by mouth daily before breakfast.      No current facility-administered medications on file prior to visit.    No Known Allergies  Objective: Physical Exam General: The patient is alert and oriented x3 in no acute distress.  Dermatology: Skin is warm, dry and supple bilateral lower extremities. Nails 1-10 are normal. There is no erythema, edema, no eccymosis, no open lesions present. Integument is otherwise unremarkable.  Vascular: Dorsalis Pedis pulse and Posterior Tibial pulse are 2/4 bilateral. Capillary fill time is immediate to all digits.  Neurological: Grossly intact to light touch bilateral.  Musculoskeletal: Tenderness to palpation at the medial calcaneal tubercale and through the insertion of the plantar fascia on the right foot, no pain with compression of calcaneus bilateral.  There is mild likely compensation pain to the fifth ray of the right foot.  No pain with calf compression bilateral. There is decreased Ankle joint range of motion bilateral. All other joints range of motion within normal limits bilateral. Strength 5/5 in all groups bilateral.   Gait: Unassisted, Antalgic avoid weight on left/right heel  Xray  right foot: Normal osseous mineralization. Joint spaces preserved. No fracture/dislocation/boney destruction.  Posterior and inferior calcaneal spur present with mild thickening of plantar fascia. No other soft tissue abnormalities or radiopaque foreign bodies.   Assessment and Plan: Problem List Items Addressed This Visit   None Visit Diagnoses     Plantar fasciitis of right foot    -  Primary   Relevant Medications   triamcinolone acetonide (KENALOG) 10 MG/ML injection 10 mg (Start on 06/02/2021  1:00 PM)   Other Relevant Orders   DG Foot Complete Right   Pain of right heel       Acquired equinus deformity of right foot           -Complete examination performed.  -Xrays reviewed -Discussed with patient in detail the condition of plantar fasciitis and equinus, how this occurs and general treatment options. Explained both conservative and surgical treatments.  -After oral consent and aseptic prep, injected a mixture containing 1 ml of 2% plain lidocaine, 1 ml 0.5% plain marcaine, 0.5 ml of kenalog 10 and 0.5 ml of dexamethasone phosphate into left/right heel. Post-injection care discussed with patient.  -Rx Meloxicam for pain and inflammation - Explained in detail the use of the night splint to assist with passive stretching of the Achilles and plantar fascia on the right which was dispensed at today's visit. -Recommend continue with good supportive shoes and plantar fascial brace which she already owns -Re-explained  to patient daily stretching exercises. -Recommend patient to ice affected area 1-2x daily and may alternate with heat as tolerated. -Discussed with patient the importance of prevention including stretching  even once pain has resolved good supportive shoes and orthotics; copy of shoe list provided -Patient to return to office in 4 weeks for follow up or sooner if problems or questions arise.  Landis Martins, DPM

## 2021-06-02 NOTE — Patient Instructions (Signed)
Shoe List For tennis shoes recommend:  Fairlawn New balance Saucony HOKA Can be purchased at [employer] sports or [employer] arch fit Can be purchased at any major retailers  Vionic  SAS Can be purchased at [employer] or [employer]   For work shoes recommend: [employer] Work [employer] Can be purchased at a variety of places or [employer]   For casual shoes recommend: Oofos Can be purchased at [employer] sports or [employer]  Can be purchased at [employer] or [place] recommend: Power Steps Can be purchased in office/[facility] [employer] Can be purchased at [employer] sports or [employer] Can be purchased at [place] recommend: Leisure centre manager at [employer] and [employer]

## 2021-06-28 ENCOUNTER — Telehealth: Payer: Self-pay | Admitting: Family Medicine

## 2021-06-28 NOTE — Telephone Encounter (Signed)
Pt is requesting to transfer care from Dr Jerline Pain to Dr Gena Fray because [facility] is closer for her and her husband

## 2021-06-28 NOTE — Telephone Encounter (Signed)
[place] with me.   Algis Greenhouse. Jerline Pain, MD 06/28/2021 1:13 PM

## 2021-06-29 NOTE — Telephone Encounter (Signed)
Called and lvm for pt to call back and schedule

## 2021-07-02 ENCOUNTER — Ambulatory Visit: Payer: No Typology Code available for payment source | Admitting: Sports Medicine

## 2021-11-30 ENCOUNTER — Ambulatory Visit (INDEPENDENT_AMBULATORY_CARE_PROVIDER_SITE_OTHER): Payer: PRIVATE HEALTH INSURANCE | Admitting: Family Medicine

## 2021-11-30 ENCOUNTER — Encounter: Payer: Self-pay | Admitting: Family Medicine

## 2021-11-30 VITALS — BP 124/78 | HR 76 | Temp 97.1°F | Ht 64.0 in | Wt 218.8 lb

## 2021-11-30 DIAGNOSIS — M654 Radial styloid tenosynovitis [de Quervain]: Secondary | ICD-10-CM

## 2021-11-30 DIAGNOSIS — E039 Hypothyroidism, unspecified: Secondary | ICD-10-CM | POA: Diagnosis not present

## 2021-11-30 LAB — TSH: TSH: 1.01 u[IU]/mL (ref 0.35–5.50)

## 2021-11-30 NOTE — Progress Notes (Signed)
[facility] [address] [place] [address] Dept: [phone] Dept Fax: [phone]  Transfer of Care Office Visit  Subjective:    Patient ID: Tracey Patel, female    DOB: 10-Jan-1956, 66 y.o..   MRN: [id number]  Chief Complaint  Patient presents with   Establish Care    [facility]- establish care.  C/o havning Rt wrist pain off/on x 1 month.  Has used Ibuprofen, Ic, and brace.   Fasting today.     History of Present Illness:  Patient is in today to establish care. Tracey Patel was born in [place], [place]. She attended college at [employer] and later [employer], majoring in nursing. She met her future husband at [employer] and moved to [facility] in 1986. They have now been married for 36 years. She has no children. She retired 8 years ago from [employer] where she was the [employer] [place] [employer]. She denies any tobacco or drug use. She drinsk alcohol 3-4 times a week.  Tracey Patel has a history of hypothyroidism since she was 66 years old. She is currently manage don levothyroxine 175 mcg daily.  Tracey Patel notes a 61-monthhistory of left wrist pain. She thought this might be carpal tunnel syndrome, but denies any numbness and her pain has been more lateral at the wrist. She admits that there is increased pain when lifting items, like cooking pans. She has tried icing this and using ibuprofen without resolution.  Past Medical History: Patient Active Problem List   Diagnosis Date Noted   Hypothyroidism 02/07/2018   Obesity 02/07/2018   Past Surgical History:  Procedure Laterality Date   BREAST CYST EXCISION Right 06/15/1985   CHOLECYSTECTOMY  1995   COLONOSCOPY  12/2020   repeat in 7 yrs   LAPAROSCOPIC HYSTERECTOMY  2001   POLYPECTOMY     TUBAL LIGATION  1991   Family History  Problem Relation Age of Onset   Heart disease Mother    Diabetes Mother    Atrial fibrillation Mother    Obesity Mother    Barrett's esophagus  Mother    Hypertension Father    Atrial fibrillation Father    Gout Father    Barrett's esophagus Father    Dementia Father    Cancer Sister        Uterine   Cancer Brother        Leukemia   Atrial fibrillation Maternal Grandmother    Heart disease Maternal Grandmother    Cancer Maternal Grandfather        Esophageal   Esophageal cancer Maternal Grandfather    Cancer Paternal Grandmother        Uterine   Heart disease Paternal Grandfather    Colon cancer Neg Hx    Pancreatic cancer Neg Hx    Rectal cancer Neg Hx    Stomach cancer Neg Hx    Colon polyps Neg Hx    Breast cancer Neg Hx    Outpatient Medications Prior to Visit  Medication Sig Dispense Refill   levothyroxine (SYNTHROID, LEVOTHROID) 175 MCG tablet Take 175 mcg by mouth daily before breakfast.      meloxicam (MOBIC) 15 MG tablet Take 1 tablet (15 mg total) by mouth daily. 30 tablet 0   No facility-administered medications prior to visit.   No Known Allergies    Objective:   Today's Vitals   11/30/21 0817  BP: 124/78  Pulse: 76  Temp: (!) 97.1 F (36.2 C)  TempSrc: Temporal  SpO2: 95%  Weight: 218 lb 12.8 oz (99.2 kg)  Height: _0  (1.626 m)   Body mass index is 37.56 kg/m.   General: Well developed, well nourished. No acute distress. Extremities: Full ROM. There is swelling noted over the radial aspect of the left wrist with mild tenderness to   palpation. Finkelstein's test + Psych: Alert and oriented. Normal mood and affect.  Health Maintenance Due  Topic Date Due   HIV Screening  Never done   Hepatitis C Screening  Never done   TETANUS/TDAP  Never done   Zoster Vaccines- Shingrix (1 of 2) Never done   INFLUENZA VACCINE  11/30/2021     Assessment & Plan:   1. Hypothyroidism, unspecified type I will recheck her TSH today.She will continue her levothyroxine 175 mcg daily for now. She will be transferring her prescription to me when her current meds run out.  - TSH  2. Tennis Must Quervain's  tenosynovitis, left Tracey Patel's exam is consistent with a tenosynovitis of the left wrist. She has already failed conservative management. I will refer her to Dr. Raeford Razor to see about fitting a thumb spica splint and considering a steroid injection.  - Ambulatory referral to Sports Medicine   Return in about 6 months (around 06/02/2022) for Reassessment.   Haydee Salter, MD

## 2021-12-07 ENCOUNTER — Ambulatory Visit: Payer: Self-pay

## 2021-12-07 ENCOUNTER — Encounter: Payer: Self-pay | Admitting: Family Medicine

## 2021-12-07 ENCOUNTER — Ambulatory Visit (INDEPENDENT_AMBULATORY_CARE_PROVIDER_SITE_OTHER): Payer: PRIVATE HEALTH INSURANCE | Admitting: Family Medicine

## 2021-12-07 VITALS — BP 128/80 | Ht 64.0 in | Wt 218.0 lb

## 2021-12-07 DIAGNOSIS — M654 Radial styloid tenosynovitis [de Quervain]: Secondary | ICD-10-CM | POA: Diagnosis not present

## 2021-12-07 DIAGNOSIS — M25532 Pain in left wrist: Secondary | ICD-10-CM

## 2021-12-07 NOTE — Progress Notes (Signed)
  Tracey Patel - 66 y.o. female MRN [id number]  Date of birth: 1956-02-01  SUBJECTIVE:  Including CC & ROS.  No chief complaint on file.   Tracey Patel is a 66 y.o. female that is presenting with left wrist pain.  The pain is present for a few weeks.  Pain is occurring at the wrist.  No specific inciting event or trigger.  Does get some relief with ibuprofen.  No history of surgery.   Review of Systems See HPI   HISTORY: Past Medical, Surgical, Social, and Family History Reviewed & Updated per EMR.   Pertinent Historical Findings include:  Past Medical History:  Diagnosis Date   Cataract    forming - small    Thyroid disease    hypothyroidism    Past Surgical History:  Procedure Laterality Date   BREAST CYST EXCISION Right 06/15/1985   CHOLECYSTECTOMY  1995   COLONOSCOPY  12/2020   repeat in 7 yrs   LAPAROSCOPIC HYSTERECTOMY  2001   POLYPECTOMY     TUBAL LIGATION  1991     PHYSICAL EXAM:  VS: BP 128/80 (BP Location: Left Arm, Patient Position: Sitting)   Ht '5\' 4"'$  (1.626 m)   Wt 218 lb (98.9 kg)   BMI 37.42 kg/m  Physical Exam Gen: NAD, alert, cooperative with exam, well-appearing MSK:  Neurovascularly intact    Limited ultrasound: Left wrist:  Normal-appearing CMC joint. Significant effusion within the first dorsal compartment. Normal-appearing second dorsal compartment  Summary: De Quervain's tenosynovitis  Ultrasound and interpretation by Clearance Coots, MD   Aspiration/Injection Procedure Note Tracey Patel February 28, 1956  Procedure: Injection Indications: Left wrist pain  Procedure Details Consent: Risks of procedure as well as the alternatives and risks of each were explained to the (patient/caregiver).  Consent for procedure obtained. Time Out: Verified patient identification, verified procedure, site/side was marked, verified correct patient position, special equipment/implants available, medications/allergies/relevent history reviewed, required  imaging and test results available.  Performed.  The area was cleaned with iodine and alcohol swabs.    The left first dorsal compartment was injected using 1 cc's of 6 mg betamethasone and 1 cc's of 0.25% bupivacaine with a 25 1 1/2" needle.  Ultrasound was used. Images were obtained in long views showing the injection.     A sterile dressing was applied.  Patient did tolerate procedure well.    ASSESSMENT & PLAN:   De Quervain's tenosynovitis, left Acutely occurring.  She does do a fair amount of work with her hands. -Counseled on home exercise therapy and supportive care. -Injection today. -Counseled on compression. -Could consider lab work.

## 2021-12-07 NOTE — Patient Instructions (Signed)
Nice to meet you Please use ice as needed Please consider compression  Please try the exercises   Please send me a message in MyChart with any questions or updates.  Please see me back in 4 weeks.   --Dr. Raeford Razor

## 2021-12-07 NOTE — Assessment & Plan Note (Signed)
Acutely occurring.  She does do a fair amount of work with her hands. -Counseled on home exercise therapy and supportive care. -Injection today. -Counseled on compression. -Could consider lab work.

## 2021-12-10 ENCOUNTER — Encounter: Payer: Self-pay | Admitting: Family Medicine

## 2021-12-13 ENCOUNTER — Other Ambulatory Visit: Payer: Self-pay | Admitting: Family Medicine

## 2021-12-13 MED ORDER — PREDNISONE 5 MG PO TABS: ORAL_TABLET | ORAL | 0 refills | Status: DC

## 2022-01-12 ENCOUNTER — Encounter: Payer: Self-pay | Admitting: Family Medicine

## 2022-01-12 DIAGNOSIS — E669 Obesity, unspecified: Secondary | ICD-10-CM

## 2022-01-13 NOTE — Addendum Note (Signed)
Addended by: Haydee Salter on: 01/13/2022 09:09 AM   Modules accepted: Orders

## 2022-01-13 NOTE — Telephone Encounter (Signed)
Please review and advise. Thanks. Dm/cma  

## 2022-01-17 ENCOUNTER — Ambulatory Visit (HOSPITAL_BASED_OUTPATIENT_CLINIC_OR_DEPARTMENT_OTHER)
Admission: RE | Admit: 2022-01-17 | Discharge: 2022-01-17 | Disposition: A | Discharge status: Home / Self Care | Payer: PRIVATE HEALTH INSURANCE | Source: Ambulatory Visit | Attending: Family Medicine | Admitting: Family Medicine

## 2022-01-17 ENCOUNTER — Ambulatory Visit (INDEPENDENT_AMBULATORY_CARE_PROVIDER_SITE_OTHER): Payer: PRIVATE HEALTH INSURANCE | Admitting: Family Medicine

## 2022-01-17 ENCOUNTER — Encounter: Payer: Self-pay | Admitting: Family Medicine

## 2022-01-17 VITALS — BP 132/80 | Ht 64.0 in | Wt 218.0 lb

## 2022-01-17 DIAGNOSIS — S63502A Unspecified sprain of left wrist, initial encounter: Secondary | ICD-10-CM | POA: Insufficient documentation

## 2022-01-17 DIAGNOSIS — M654 Radial styloid tenosynovitis [de Quervain]: Secondary | ICD-10-CM

## 2022-01-17 NOTE — Assessment & Plan Note (Signed)
Acutely occurring.  She did suffer a fall on her trip.  Symptoms may be more associated with degenerative changes of the [facility] joint.  -Counseled on home exercise therapy and supportive care. -Counseled on bracing. -Referral to Occupational Therapy. -X-ray. -Could consider joint injection

## 2022-01-17 NOTE — Progress Notes (Signed)
  Tracey Patel - 66 y.o. female MRN [id number]  Date of birth: 1955-06-06  SUBJECTIVE:  Including CC & ROS.  No chief complaint on file.   Tracey Patel is a 66 y.o. female that is following up for her left wrist pain.  The pain is occurring over the scaphoid.  She did well with the injection and steroids.  Pain is localized in nature.  She did suffer a fall on her vacation.   Review of Systems See HPI   HISTORY: Past Medical, Surgical, Social, and Family History Reviewed & Updated per EMR.   Pertinent Historical Findings include:  Past Medical History:  Diagnosis Date   Cataract    forming - small    Thyroid disease    hypothyroidism    Past Surgical History:  Procedure Laterality Date   BREAST CYST EXCISION Right 06/15/1985   CHOLECYSTECTOMY  1995   COLONOSCOPY  12/2020   repeat in 7 yrs   LAPAROSCOPIC HYSTERECTOMY  2001   POLYPECTOMY     TUBAL LIGATION  1991     PHYSICAL EXAM:  VS: BP 132/80 (BP Location: Left Arm, Patient Position: Sitting)   Ht '5\' 4"'$  (1.626 m)   Wt 218 lb (98.9 kg)   BMI 37.42 kg/m  Physical Exam Gen: NAD, alert, cooperative with exam, well-appearing MSK: Neurovascularly intact       ASSESSMENT & PLAN:   De Quervain's tenosynovitis, left Having pain over the first dorsal compartment. May have component of underlying arthritis in the same area -Counseled on home exercise therapy and supportive care. -Counseled on bracing. -Referral to Occupational Therapy. -X-ray.   Wrist sprain, left, initial encounter Acutely occurring.  She did suffer a fall on her trip.  Symptoms may be more associated with degenerative changes of the [facility] joint.  -Counseled on home exercise therapy and supportive care. -Counseled on bracing. -Referral to Occupational Therapy. -X-ray. -Could consider joint injection

## 2022-01-17 NOTE — Assessment & Plan Note (Signed)
Having pain over the first dorsal compartment. May have component of underlying arthritis in the same area -Counseled on home exercise therapy and supportive care. -Counseled on bracing. -Referral to Occupational Therapy. -X-ray.

## 2022-01-17 NOTE — Patient Instructions (Signed)
Good to see you Please use ice as needed  I will call with the xray results.  I have made a referral to physical therapy  Please send me a message in MyChart with any questions or updates.  Please see me back in 4-6 weeks.   --Dr. Raeford Razor

## 2022-01-18 ENCOUNTER — Telehealth: Payer: Self-pay | Admitting: Family Medicine

## 2022-01-18 NOTE — Telephone Encounter (Signed)
Informed of results.   Rosemarie Ax, MD [facility] 01/18/2022, 1:29 PM

## 2022-01-28 ENCOUNTER — Encounter: Payer: Self-pay | Admitting: Occupational Therapy

## 2022-01-28 ENCOUNTER — Ambulatory Visit: Payer: PRIVATE HEALTH INSURANCE | Attending: Family Medicine | Admitting: Occupational Therapy

## 2022-01-28 ENCOUNTER — Other Ambulatory Visit: Payer: Self-pay

## 2022-01-28 DIAGNOSIS — M25632 Stiffness of left wrist, not elsewhere classified: Secondary | ICD-10-CM | POA: Insufficient documentation

## 2022-01-28 DIAGNOSIS — M6281 Muscle weakness (generalized): Secondary | ICD-10-CM | POA: Diagnosis present

## 2022-01-28 DIAGNOSIS — M25532 Pain in left wrist: Secondary | ICD-10-CM | POA: Insufficient documentation

## 2022-01-28 DIAGNOSIS — R600 Localized edema: Secondary | ICD-10-CM | POA: Diagnosis present

## 2022-01-28 NOTE — Therapy (Signed)
OUTPATIENT OCCUPATIONAL THERAPY ORTHO EVALUATION  Patient Name: Tracey Patel MRN: [id number] DOB:05/03/1955, 66 y.o., female Today's Date: 01/28/2022  PCP: Haydee Salter, MD REFERRING PROVIDER: Rosemarie Ax, MD   OT End of Session - 01/28/22 0802     Visit Number 1    OT Start Time 0801    OT Stop Time 0623    OT Time Calculation (min) 40 min    Behavior During Therapy [facility] for tasks assessed/performed            Past Medical History:  Diagnosis Date   Cataract    forming - small    Thyroid disease    hypothyroidism   Past Surgical History:  Procedure Laterality Date   BREAST CYST EXCISION Right 06/15/1985   CHOLECYSTECTOMY  1995   COLONOSCOPY  12/2020   repeat in 7 yrs   [place]   Patient Active Problem List   Diagnosis Date Noted   Wrist sprain, left, initial encounter 01/17/2022   De Quervain's tenosynovitis, left 12/07/2021   Hypothyroidism 02/07/2018   Obesity 02/07/2018    ONSET DATE: 01/17/2022 (date of OT order)  REFERRING DIAG: S63.502A (ICD-10-CM) - Wrist sprain, left, initial encounter M65.4 (ICD-10-CM) - De Quervain's tenosynovitis, left  THERAPY DIAG:  Pain in left wrist  Stiffness of left wrist, not elsewhere classified  Muscle weakness (generalized)  Localized edema  Rationale for Evaluation and Treatment Rehabilitation  SUBJECTIVE:   SUBJECTIVE STATEMENT: Pt arrives to OP OT evaluation w/ concern of L wrist pain. States she had DeQuervain's symptoms for a few months and received an injection that helped. She then lost her balance in [place] and caught herself by bracing through her L arm, which caused the wrist to start hurting again. Takes Aleve/ibuprofen prn and reports no pain, just stiffness. Pt accompanied by: self  PERTINENT HISTORY: De Quervain's tenosynovitis, left Having pain over the first dorsal compartment. May have component of underlying  arthritis in the same area. Wrist sprain, left, initial encounter Acutely occurring. She did suffer a fall on her trip. Symptoms may be more associated with degenerative changes of the [facility] joint.   PRECAUTIONS: None  WEIGHT BEARING RESTRICTIONS No, but does report pain w/ wb  PAIN:  Are you having pain? No  FALLS: Has patient fallen in last 6 months? No  LIVING ENVIRONMENT: Lives with: lives with their spouse Lives in: House/apartment Stairs:  a few steps to enter   Has following equipment at home: None  PLOF: Independent, Vocation/Vocational requirements: retired Marine scientist, and Leisure: gardening, travel, read, knit, sew  PATIENT GOALS: "Not to have pain in my wrist"  OBJECTIVE:   HAND DOMINANCE: Right  ADLs: Overall ADLs: Reports Mod Ind w/ most ADLs; occasionally requires task modification w/ clothing fasteners due to pain  UPPER EXTREMITY ROM:  Active ROM Right Eval - 9/29 Left Eval - 9/29  Wrist flexion 58 51  Wrist extension 66 55  Wrist ulnar deviation 34 36  Wrist radial deviation 20 20  Wrist pronation    Wrist supination 81 82  (Blank rows = not tested)  Active ROM Right Eval - 9/29 Left Eval - 9/29  Thumb Radial abd/add (0-55) 62 47  Thumb Palmar abd/add (0-45) 40 40  (Blank rows = not tested)  UPPER EXTREMITY MMT: L wrist grossly 4/5; slight incr pain when testing deviation  HAND FUNCTION: Grip strength: Right: 70 lbs; Left: 56 lbs  SENSATION: WFL  EDEMA:  Right: 17.0 cm, Left: 17.9 cm measured circumferentially around wrist  COGNITION: Overall cognitive status: Within functional limits for tasks assessed  OBSERVATIONS: PROM WFL; reports sharp pain (7/10) w/ AROM of L wrist radial deviation   TODAY'S TREATMENT: None - eval only   PATIENT EDUCATION: Educated on role and purpose of OT as well as potential interventions and goals for therapy based on initial evaluation findings.  Person educated: Patient Education method:  Explanation Education comprehension: verbalized understanding   HOME EXERCISE PROGRAM: To be administered  GOALS: Goals reviewed with patient? No  SHORT TERM GOALS: Target date: 02/18/2022      Status:  1 Pt will demonstrate independence w/ HEP designed for LUE  (self-ROM/stretches, stabilization)  Baseline: to be administered Initial  2 Pt will verbalize understanding of at least 2 edema management strategies and decrease edema circumferentially around L wrist to at least 17.5 cm Baseline: Right: 17.0 cm, Left: 17.9 cm Initial     LONG TERM GOALS: Target date: 03/11/2022      Status:  1 Pt will improve ROM of L wrist ext to within at least 5 degrees compared to R wrist ext w/ pain less than 2/10 by discharge Baseline: Right: 66 deg, Left: 55 deg Initial  2 Pt will report being able to manipulate clothing fasteners w/out increased pain more than 50% of the time by discharge Baseline: difficulty, per pt report Initial  3 Pt will be able to lift/retrieve a medium-weight object (~5 lbs) from head/overhead height w/ BUEs w/out increased pain in L wrist by discharge for improved safety during IADL tasks Baseline: increased pain w/ sustained/tight grasp Initial     ASSESSMENT:  CLINICAL IMPRESSION: Pt is a 66 y/o who presents to OP OT due to persistent wrist pain. Pt was referred by Dr. Raeford Razor and is being treated conservatively. Evaluation indicated decreased strength, edema, and limited ROM w/ testing all somewhat limited by pain. Pt will benefit from skilled occupational therapy services to address strength, ROM, pain management, introduction of compensatory strategies/AE prn, and implementation of an HEP to improve participation and safety during ADLs and ensure maximal functional use of LUE.  PERFORMANCE DEFICITS in functional skills including edema, ROM, strength, pain, and UE functional use.  IMPAIRMENTS are limiting patient from ADLs, IADLs, rest and sleep, and leisure.    COMORBIDITIES may have co-morbidities  that affects occupational performance. Patient will benefit from skilled OT to address above impairments and improve overall function.  MODIFICATION OR ASSISTANCE TO COMPLETE EVALUATION: No modification of tasks or assist necessary to complete an evaluation.  OT OCCUPATIONAL PROFILE AND HISTORY: Problem focused assessment: Including review of records relating to presenting problem.  CLINICAL DECISION MAKING: LOW - limited treatment options, no task modification necessary  REHAB POTENTIAL: Good  EVALUATION COMPLEXITY: Low   PLAN: OT FREQUENCY: 1x/week  OT DURATION: 6 weeks  PLANNED INTERVENTIONS: self care/ADL training, therapeutic exercise, therapeutic activity, manual therapy, passive range of motion, splinting, electrical stimulation, ultrasound, iontophoresis, paraffin, fluidotherapy, compression bandaging, moist heat, cryotherapy, patient/family education, and DME and/or AE instructions  RECOMMENDED OTHER SERVICES: None  CONSULTED AND AGREED WITH PLAN OF CARE: Patient  PLAN FOR NEXT SESSION: Review PROM exercises for wrist, initiate mid-arc AROM as able   Kathrine Cords, MSOT, OTR/L 01/28/2022, 8:40 AM

## 2022-02-03 ENCOUNTER — Ambulatory Visit: Payer: PRIVATE HEALTH INSURANCE | Admitting: Dietician

## 2022-02-04 ENCOUNTER — Ambulatory Visit: Payer: PRIVATE HEALTH INSURANCE | Attending: Family Medicine | Admitting: Occupational Therapy

## 2022-02-04 ENCOUNTER — Encounter: Payer: Self-pay | Admitting: Occupational Therapy

## 2022-02-04 DIAGNOSIS — M6281 Muscle weakness (generalized): Secondary | ICD-10-CM | POA: Insufficient documentation

## 2022-02-04 DIAGNOSIS — M25632 Stiffness of left wrist, not elsewhere classified: Secondary | ICD-10-CM | POA: Insufficient documentation

## 2022-02-04 DIAGNOSIS — R600 Localized edema: Secondary | ICD-10-CM | POA: Insufficient documentation

## 2022-02-04 DIAGNOSIS — M25532 Pain in left wrist: Secondary | ICD-10-CM | POA: Insufficient documentation

## 2022-02-04 NOTE — Addendum Note (Signed)
Addended by: Kathrine Cords on: 02/04/2022 08:02 AM   Modules accepted: Orders

## 2022-02-04 NOTE — Therapy (Signed)
OUTPATIENT OCCUPATIONAL THERAPY TREATMENT NOTE  Patient Name: Tracey Patel MRN: [id number] DOB:[birth year], 66 y.o., female Today's Date: 02/04/2022  PCP: Haydee Salter, MD REFERRING PROVIDER: Rosemarie Ax, MD   OT End of Session - 02/04/22 0803     Visit Number 2    Date for OT Re-Evaluation 03/11/22    Authorization Time Period VL: [place]    OT Start Time 0803    OT Stop Time 0843    OT Time Calculation (min) 40 min    Behavior During Therapy [facility] for tasks assessed/performed            Past Medical History:  Diagnosis Date   Cataract    forming - small    Thyroid disease    hypothyroidism   Past Surgical History:  Procedure Laterality Date   BREAST CYST EXCISION Right 06/15/1985   CHOLECYSTECTOMY  1995   COLONOSCOPY  12/2020   repeat in 7 yrs   [place]   Patient Active Problem List   Diagnosis Date Noted   Wrist sprain, left, initial encounter 01/17/2022   De Quervain's tenosynovitis, left 12/07/2021   Hypothyroidism 02/07/2018   Obesity 02/07/2018    ONSET DATE: 01/17/2022 (date of OT order)  REFERRING DIAG: S63.502A (ICD-10-CM) - Wrist sprain, left, initial encounter M65.4 (ICD-10-CM) - De Quervain's tenosynovitis, left  THERAPY DIAG:  Pain in left wrist  Stiffness of left wrist, not elsewhere classified  Muscle weakness (generalized)  Localized edema  Rationale for Evaluation and Treatment Rehabilitation  SUBJECTIVE:   SUBJECTIVE STATEMENT: Pt reports some pain when she tries to pull her hair up into a ponytail Pt accompanied by: self  PERTINENT HISTORY: De Quervain's tenosynovitis, left Having pain over the first dorsal compartment. May have component of underlying arthritis in the same area. Wrist sprain, left, initial encounter Acutely occurring. She did suffer a fall on her trip. Symptoms may be more associated with degenerative changes of the [facility] joint.    PAIN: Are you having pain? No  PLOF: Independent, Vocation/Vocational requirements: retired Marine scientist, and Leisure: gardening, travel, read, knit, sew  PATIENT GOALS: "Not to have pain in my wrist"  OBJECTIVE:   HAND DOMINANCE: Right  UPPER EXTREMITY ROM:  Active ROM Right Eval - 9/29 Left Eval - 9/29  Wrist flexion 58 51  Wrist extension 66 55  Wrist ulnar deviation 34 36  Wrist radial deviation 20 20  Wrist pronation    Wrist supination 81 82  (Blank rows = not tested)  Active ROM Right Eval - 9/29 Left Eval - 9/29  Thumb Radial abd/add (0-55) 62 47  Thumb Palmar abd/add (0-45) 40 40  (Blank rows = not tested)  EDEMA:  Right: 17.0 cm, Left: 17.9 cm measured circumferentially around wrist   TODAY'S TREATMENT:  Manual Therapy ***  Therapeutic Exercise ***    PATIENT EDUCATION: Ongoing condition-specific education related to therapeutic interventions completed this session Person educated: Patient Education method: Explanation Education comprehension: verbalized understanding   HOME EXERCISE PROGRAM: To be administered   GOALS: Goals reviewed with patient? No  SHORT TERM GOALS: Target date: 02/18/2022      Status:  1 Pt will demonstrate independence w/ HEP designed for LUE  (self-ROM/stretches, stabilization)  Baseline: to be administered Progressing  2 Pt will verbalize understanding of at least 2 edema management strategies and decrease edema circumferentially around L wrist to at least 17.5 cm Baseline: Right: 17.0  cm, Left: 17.9 cm Progressing     LONG TERM GOALS: Target date: 03/11/2022      Status:  1 Pt will improve ROM of L wrist ext to within at least 5 degrees compared to R wrist ext w/ pain less than 2/10 by discharge Baseline: Right: 66 deg, Left: 55 deg Progressing  2 Pt will report being able to manipulate clothing fasteners w/out increased pain more than 50% of the time by discharge Baseline: difficulty, per pt report Progressing  3  Pt will be able to lift/retrieve a medium-weight object (~5 lbs) from head/overhead height w/ BUEs w/out increased pain in L wrist by discharge for improved safety during IADL tasks Baseline: increased pain w/ sustained/tight grasp Progressing     ASSESSMENT:  CLINICAL IMPRESSION: Pt arrives for first treatment session following initial evaluation on 01/28/22. OT reviewed goals w/ pt who is agreeable to plan of care at this time.  PERFORMANCE DEFICITS in functional skills including edema, ROM, strength, pain, and UE functional use.  IMPAIRMENTS are limiting patient from ADLs, IADLs, rest and sleep, and leisure.   COMORBIDITIES may have co-morbidities  that affects occupational performance. Patient will benefit from skilled OT to address above impairments and improve overall function.   PLAN: OT FREQUENCY: 1x/week  OT DURATION: 6 weeks  PLANNED INTERVENTIONS: self care/ADL training, therapeutic exercise, therapeutic activity, manual therapy, passive range of motion, splinting, electrical stimulation, ultrasound, iontophoresis, paraffin, fluidotherapy, compression bandaging, moist heat, cryotherapy, patient/family education, and DME and/or AE instructions  RECOMMENDED OTHER SERVICES: None  CONSULTED AND AGREED WITH PLAN OF CARE: Patient  PLAN FOR NEXT SESSION: Review PROM exercises for wrist, initiate mid-arc AROM as able   Kathrine Cords, MSOT, OTR/L 02/04/2022, 8:49 AM

## 2022-02-07 ENCOUNTER — Other Ambulatory Visit: Payer: Self-pay

## 2022-02-07 MED ORDER — LEVOTHYROXINE SODIUM 175 MCG PO TABS: 175.0000 ug | ORAL_TABLET | Freq: Every day | ORAL | 3 refills | Status: DC

## 2022-02-07 NOTE — Telephone Encounter (Signed)
Refill request for Euthyrox 175 mcg LR Hx provider LOV 11/30/21 FOV 06/02/22  Please review and advise.  Thanks. Dm/cma

## 2022-02-14 ENCOUNTER — Encounter: Payer: Self-pay | Admitting: Occupational Therapy

## 2022-02-14 ENCOUNTER — Ambulatory Visit: Payer: PRIVATE HEALTH INSURANCE | Admitting: Occupational Therapy

## 2022-02-14 DIAGNOSIS — R600 Localized edema: Secondary | ICD-10-CM

## 2022-02-14 DIAGNOSIS — M6281 Muscle weakness (generalized): Secondary | ICD-10-CM

## 2022-02-14 DIAGNOSIS — M25632 Stiffness of left wrist, not elsewhere classified: Secondary | ICD-10-CM

## 2022-02-14 DIAGNOSIS — M25532 Pain in left wrist: Secondary | ICD-10-CM | POA: Diagnosis not present

## 2022-02-14 NOTE — Therapy (Signed)
OUTPATIENT OCCUPATIONAL THERAPY TREATMENT NOTE & DISCHARGE SUMMARY  Patient Name: Tracey Patel MRN: [id number] DOB:Mar 23, 1956, 66 y.o., female Today's Date: 02/14/2022  PCP: Tracey Salter, MD REFERRING PROVIDER: Rosemarie Ax, MD   OT End of Session - 02/14/22 0804     Visit Number 3    Date for OT Re-Evaluation 03/11/22    Authorization Time Period VL: [place]    OT Start Time 0803    OT Stop Time 0841    OT Time Calculation (min) 38 min    Behavior During Therapy [facility] for tasks assessed/performed            OCCUPATIONAL THERAPY DISCHARGE SUMMARY  Visits from Start of Care: 3  Current functional level related to goals / functional outcomes: Pt reports she is able to complete all ADLs w/ at least Mod Ind; see goals below for detail   Remaining deficits: Pain w/ weight bearing through L hand   Education / Equipment: Condition-specific education; pt-specific HEP; pain and edema management strategies  Patient agrees to discharge. Patient goals were partially met. Patient is being discharged due to being pleased with the current functional level.   Past Medical History:  Diagnosis Date   Cataract    forming - small    Thyroid disease    hypothyroidism   Past Surgical History:  Procedure Laterality Date   BREAST CYST EXCISION Right 06/15/1985   CHOLECYSTECTOMY  1995   COLONOSCOPY  12/2020   repeat in 7 yrs   [place]   Patient Active Problem List   Diagnosis Date Noted   Wrist sprain, left, initial encounter 01/17/2022   De Quervain's tenosynovitis, left 12/07/2021   Hypothyroidism 02/07/2018   Obesity 02/07/2018    ONSET DATE: 01/17/2022 (date of OT order)  REFERRING DIAG: S63.502A (ICD-10-CM) - Wrist sprain, left, initial encounter M65.4 (ICD-10-CM) - De Quervain's tenosynovitis, left  THERAPY DIAG:  Pain in left wrist  Stiffness of left wrist, not elsewhere classified  Muscle  weakness (generalized)  Localized edema  Rationale for Evaluation and Treatment Rehabilitation  SUBJECTIVE:   SUBJECTIVE STATEMENT: Pt reports the only pain is w/ weight bearing Pt accompanied by: self  PERTINENT HISTORY: De Quervain's tenosynovitis, left Having pain over the first dorsal compartment. May have component of underlying arthritis in the same area. Wrist sprain, left, initial encounter Acutely occurring. She did suffer a fall on her trip. Symptoms may be more associated with degenerative changes of the [facility] joint.   PAIN: Are you having pain? No  PLOF: Independent, Vocation/Vocational requirements: retired Marine scientist, and Leisure: gardening, travel, read, knit, sew  PATIENT GOALS: "Not to have pain in my wrist"   OBJECTIVE:   HAND DOMINANCE: Right  UPPER EXTREMITY ROM:  Active ROM Right Eval - 9/29 Left Eval - 9/29 Left 10/16  Wrist flexion 58 51   Wrist extension 66 55 58  Wrist ulnar deviation 34 36   Wrist radial deviation 20 20   Wrist pronation     Wrist supination 81 82   (Blank rows = not tested)  Active ROM Right Eval - 9/29 Left Eval - 9/29  Thumb Radial abd/add (0-55) 62 47  Thumb Palmar abd/add (0-45) 40 40  (Blank rows = not tested)  EDEMA:  Right: 17.0 cm, Left: 17.9 cm measured circumferentially around wrist   TODAY'S TREATMENT:  Therapeutic Exercise L wrist radial deviation w/ 1 lb dumbbell; completed 3x10 w/  OT providing occ verbal/tactile cues to decrease compensatory wrist ext  L wrist ext w/ 1 lb dumbbell 3x10 on arm elevation wedge  L wrist flex w/ 1 lb dumbbell 3x10 on arm elevation wedge  L hand thumb and finger ext against resistance completed 2x10; 1st set w/ finer exerciser and 2nd set w/ yellow putty  Self-stretch of L thumb adduct w/ wrist ext completed 10x, holding position at end range; pt reported no incr pain    PATIENT EDUCATION: Reviewed and updated complete HEP; discussed pain/edema management strategies,  including ice pack/massage; gentle soft tissue massage and/or heat pack prior to exercises Person educated: Patient Education method: Explanation Education comprehension: verbalized understanding   HOME EXERCISE PROGRAM: Access Code: [email or web address] URL: [email or web address]  Exercises - Seated Wrist Ulnar Deviation PROM  - 2-3 x daily - 15 reps - 10 sec hold - Thumb Radial Abduction PROM  - 2-3 x daily - 15 reps - 10 sec hold - Seated Wrist Extension PROM  - 2-3 x weekly - 15 reps - 10 sec hold - Thumb Opposition with Slide  - 2-3 x daily - 1 sets - 15 reps - Seated Wrist Radial and Ulnar Deviation AROM  - 2-3 x daily - 1 sets - 15 reps - Putty Squeezes  - 1 sets - 25 reps - Resisted Finger Extension and Thumb Abduction  - 3 sets - 10 reps - Seated Wrist Radial Deviation with Dumbbell  - 3 sets - 10 reps - Seated Wrist Extension with Dumbbell  - 3 sets - 10 reps - Seated Wrist Flexion with Dumbbell  - 3 sets - 10 reps   GOALS: Goals reviewed with patient? No  SHORT TERM GOALS: Target date: 02/18/2022      Status:  1 Pt will demonstrate independence w/ HEP designed for LUE  (self-ROM/stretches, stabilization)  Baseline: to be administered Met - 02/14/22  2 Pt will verbalize understanding of at least 2 edema management strategies and decrease edema circumferentially around L wrist to at least 17.5 cm Baseline: Right: 17.0 cm, Left: 17.9 cm Met - 02/14/22: 17.5 cm     LONG TERM GOALS: Target date: 03/11/2022      Status:  1 Pt will improve ROM of L wrist ext to within at least 5 degrees compared to R wrist ext w/ pain less than 2/10 by discharge Baseline: Right: 66 deg, Left: 55 deg Partially met - 02/14/22: able to achieve 58 deg of ext w/out pain  2 Pt will report being able to manipulate clothing fasteners w/out increased pain more than 50% of the time by discharge Baseline: difficulty, per pt report Met - 02/14/22  3 Pt will be able to lift/retrieve a  medium-weight object (~5 lbs) from head/overhead height w/ BUEs w/out increased pain in L wrist by discharge for improved safety during IADL tasks Baseline: increased pain w/ sustained/tight grasp Met - 02/14/22     ASSESSMENT:  CLINICAL IMPRESSION: Tracey Patel is a 66 y/o who has been seen in OP OT for L wrist pain 2/2 De Quervain's tenosynovitis. Pt has shown improvements in all areas addressed in therapy w/ both STGs and 2/3 LTGs met. Pt did demonstrate improved L wrist extension w/out pain (LTG1), but did not improve range to within 6 degrees as compared to R side. In preparation for potential d/c this session, OT reviewed and updated complete HEP and all previously discussed education w/ pt respectively verbalizing understanding and/or returning demonstration w/out difficulty. Pt is appropriate for d/c from  skilled OT to HEP at this time, reports she is satisfied with progress, reporting no further functional concerns at this time, and is currently agreeable to discharge plan.    PLAN: OT FREQUENCY: 1x/week  OT DURATION: 6 weeks  PLANNED INTERVENTIONS: self care/ADL training, therapeutic exercise, therapeutic activity, manual therapy, passive range of motion, splinting, electrical stimulation, ultrasound, iontophoresis, paraffin, fluidotherapy, compression bandaging, moist heat, cryotherapy, patient/family education, and DME and/or AE instructions  RECOMMENDED OTHER SERVICES: None  CONSULTED AND AGREED WITH PLAN OF CARE: Patient  PLAN FOR NEXT SESSION: D/C   Kathrine Cords, MSOT, OTR/L 02/14/2022, 8:43 AM

## 2022-02-21 ENCOUNTER — Ambulatory Visit: Payer: PRIVATE HEALTH INSURANCE | Admitting: Occupational Therapy

## 2022-02-21 ENCOUNTER — Ambulatory Visit: Payer: PRIVATE HEALTH INSURANCE | Admitting: Family Medicine

## 2022-02-23 ENCOUNTER — Encounter: Payer: Self-pay | Admitting: Dietician

## 2022-02-23 ENCOUNTER — Encounter: Payer: PRIVATE HEALTH INSURANCE | Attending: Family Medicine | Admitting: Dietician

## 2022-02-23 DIAGNOSIS — E669 Obesity, unspecified: Secondary | ICD-10-CM | POA: Insufficient documentation

## 2022-02-23 DIAGNOSIS — Z713 Dietary counseling and surveillance: Secondary | ICD-10-CM | POA: Insufficient documentation

## 2022-02-23 DIAGNOSIS — Z6836 Body mass index (BMI) 36.0-36.9, adult: Secondary | ICD-10-CM

## 2022-02-23 NOTE — Patient Instructions (Addendum)
Aim for 150 minutes of physical activity weekly.  Eat more Non-Starchy Vegetables.  Minimize added sugars and refined grains.  Rethink what you drink. Choose beverages without added sugar. Look for 0 carbs on the label.  Choose whole foods over processed.  Make simple meals at home more often than eating out.  Aim to eat within 1-2 hours of waking up and every 3-5 hours following.

## 2022-02-23 NOTE — Progress Notes (Signed)
Medical Nutrition Therapy  Appointment Start time:  [phone]  Appointment End time:  [phone]  Primary concerns today: Pt states she is here today because she is overweight and states her mom passed from non-alcoholic steatohepatitis NAFLD and both parents had diabetes and pt wants to avoid all these chronic conditions for herself.  Referral diagnosis: E66.9 Preferred learning style: no preference indicated Learning readiness: ready   NUTRITION ASSESSMENT   Anthropometrics  Ht: 54in Wt: 214.7lbs  Clinical Medical Hx: cataracts, thyroid disease Medications: reviewed Labs: reviewed Notable Signs/Symptoms: none Food Allergies: none  Lifestyle & Dietary Hx  Pt lives with husband. Pt states her husband thinks to lose weight they need to eat 1 meal per day, but pt advised him against this and right now they're eating 2 meals a day but pt will have a snack in between meals.   Pt has high stress because her dad is in skilled nursing and she is his healthcare power of attorney. Her mother in law is 32 and she has to help take care of her. Her husband has chronic illness. All of these are high stressors.  Pt wakes up at 5am, and has a snack before going on a 30 min to 1 hour walk. Eats with husband at 10am, and eats dinner at 7pm.   Pt reports they eat fish 2-3 times per week, rarely eat red meat or pork, and sometimes have poultry.   Estimated daily fluid intake: 48-70 oz Supplements: MVI Sleep: varies, sometimes 7-8 hours, sometimes less.  Stress / self-care: high stress, lots of sick family members.  Current average weekly physical activity: walks 30-60 min, 4-5 times per week.   24-Hr Dietary Recall Snack: 6am: spoon of peanut butter OR toast with peanut butter First Meal: 10am: fruit and english muffin and egg OR oatmeal with nuts and raisins Second Meal: none Snack: handful of nuts Third Meal: 7pm: lean protein (chicken or fish) vegetables and starch Snack: none Beverages: coffee,  water, glass of wine   NUTRITION DIAGNOSIS  NB-1.1 Food and nutrition-related knowledge deficit As related to lack of prior nutrition education.  As evidenced by pt report and diet hx.   NUTRITION INTERVENTION  Nutrition education (E-1) on the following topics:  Building balanced snacks Having consistent mealtimes Omega 3 sources Importance of adequate vitamin D and calcium Balance of carbohydrate, protein, fruits and vegetables Functions of fiber Building balanced meals Saturated vs unsaturated fat Balanced vegetarian meals  Handouts Provided Include  Dish Up a Healthy Meal Balanced Snacks  Learning Style & Readiness for Change Teaching method utilized: Visual & Auditory  Demonstrated degree of understanding via: Teach Back  Barriers to learning/adherence to lifestyle change: none  Goals Established by Pt Aim for 150 minutes of physical activity weekly.  Eat more Non-Starchy Vegetables.  Minimize added sugars and refined grains.  Rethink what you drink. Choose beverages without added sugar. Look for 0 carbs on the label.  Choose whole foods over processed.  Make simple meals at home more often than eating out.  Aim to eat within 1-2 hours of waking up and every 3-5 hours following.    MONITORING & EVALUATION Dietary intake, weekly physical activity, and follow up as needed.  Next Steps  Patient is to call for questions.

## 2022-02-28 ENCOUNTER — Encounter: Payer: PRIVATE HEALTH INSURANCE | Admitting: Occupational Therapy

## 2022-04-04 ENCOUNTER — Other Ambulatory Visit: Payer: Self-pay | Admitting: Obstetrics & Gynecology

## 2022-04-04 DIAGNOSIS — Z1231 Encounter for screening mammogram for malignant neoplasm of breast: Secondary | ICD-10-CM

## 2022-05-19 IMAGING — MG MM DIGITAL SCREENING BILAT W/ TOMO AND CAD
8 series · 8 of 24 positions shown · non-contrast
Comparison: Previous exam(s).

CLINICAL DATA: Screening.

EXAM:
DIGITAL SCREENING BILATERAL MAMMOGRAM WITH TOMOSYNTHESIS AND CAD
TECHNIQUE: Bilateral screening digital craniocaudal and mediolateral oblique
mammograms were obtained. Bilateral screening digital breast
tomosynthesis was performed. The images were evaluated with
computer-aided detection.

[R CC synth-2D]
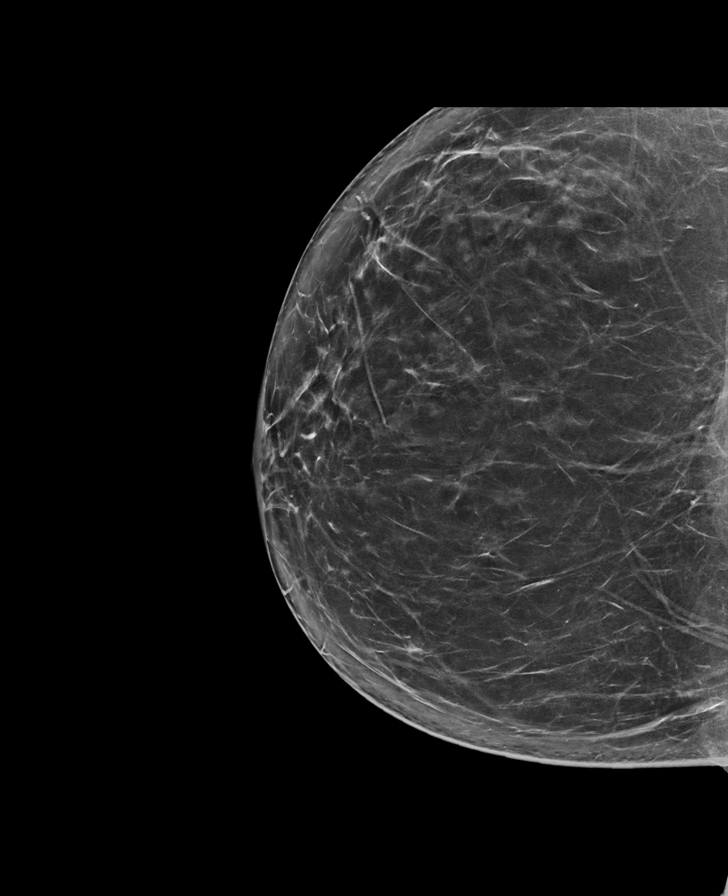

[L MLO synth-2D]
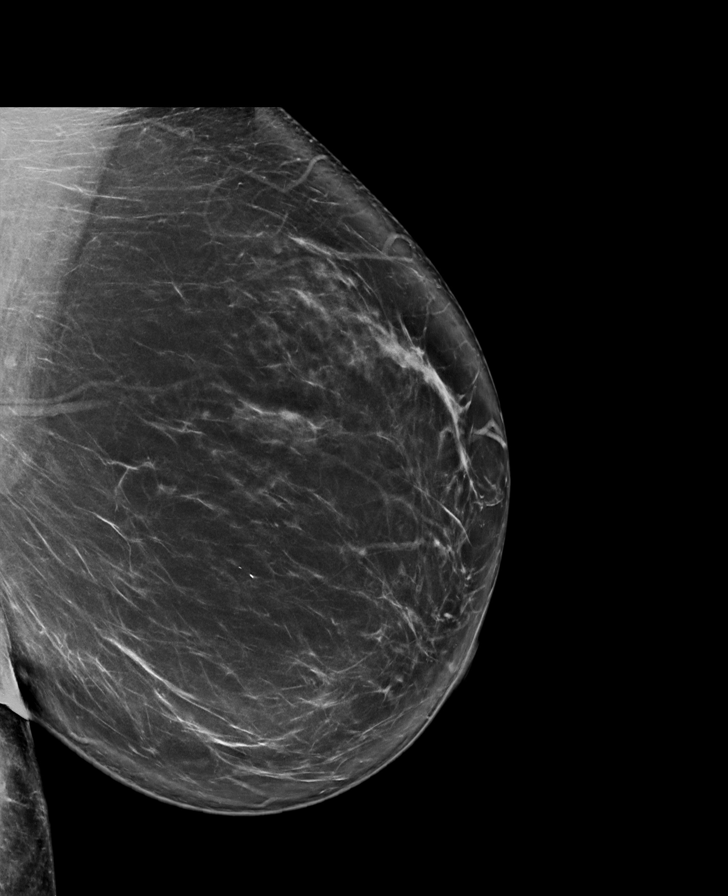

[R MLO synth-2D]
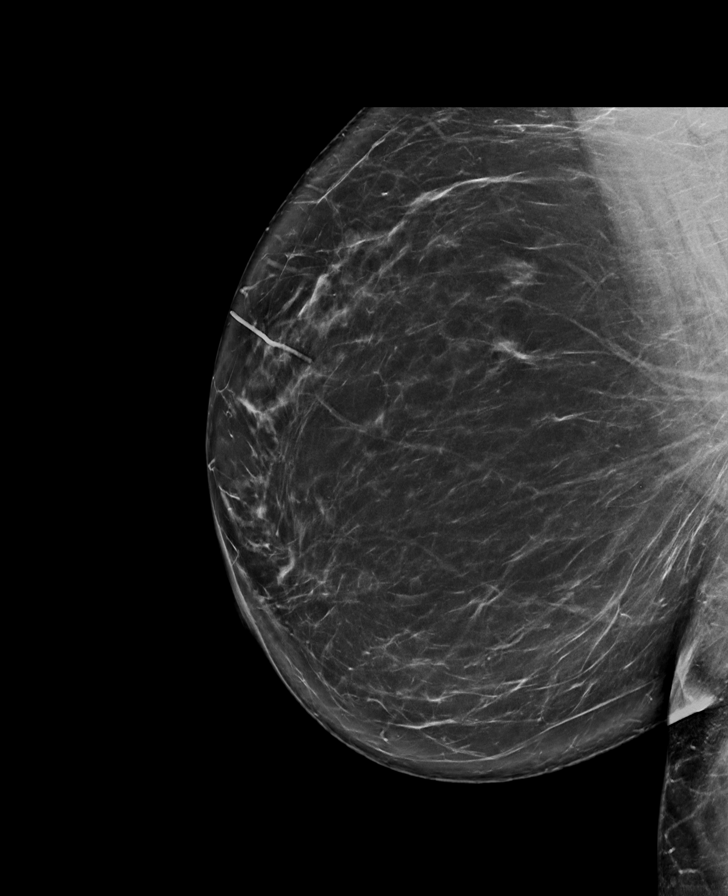

[L CC synth-2D]
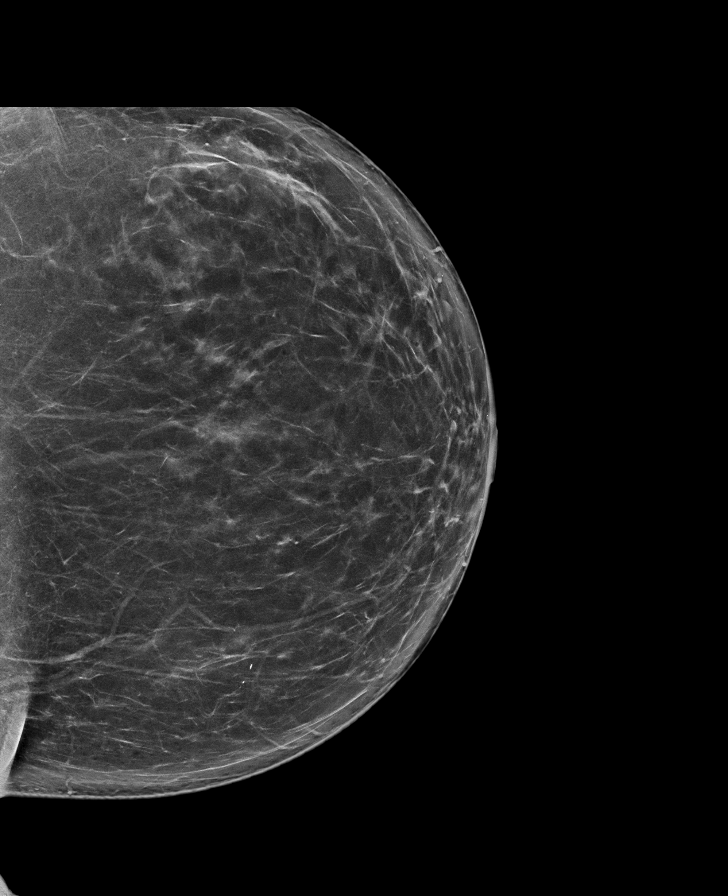

[R MLO tomo · tomo slice 47/94.0]
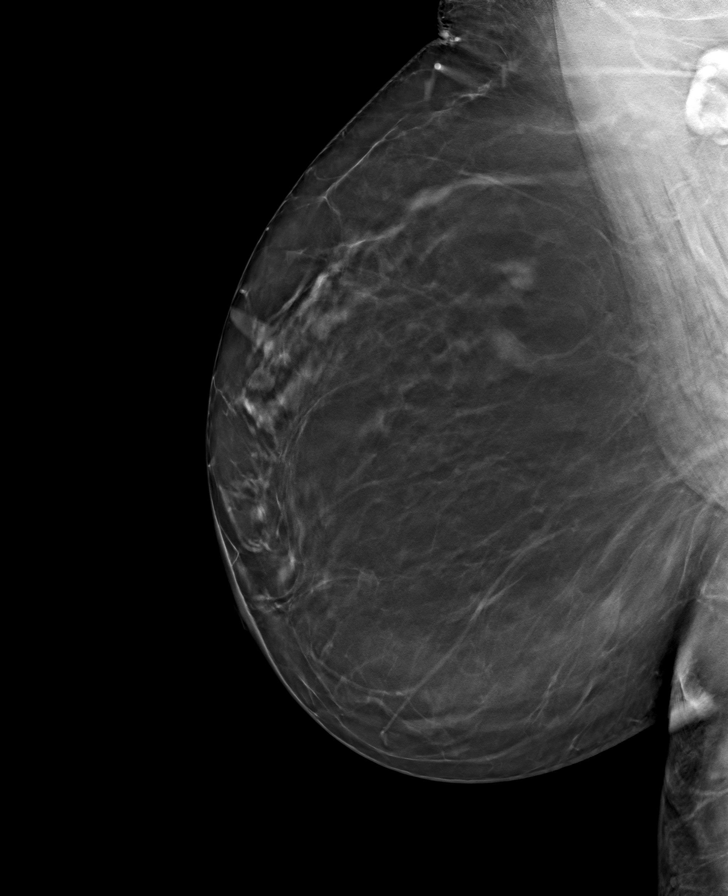

[R CC tomo · tomo slice 45/89.0]
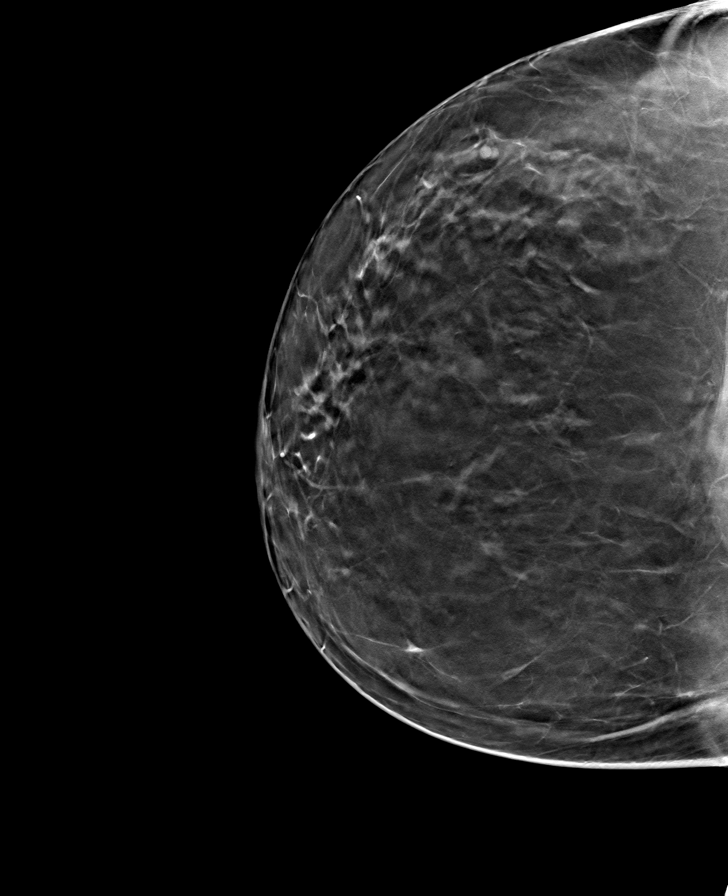

[L CC tomo · tomo slice 45/88.0]
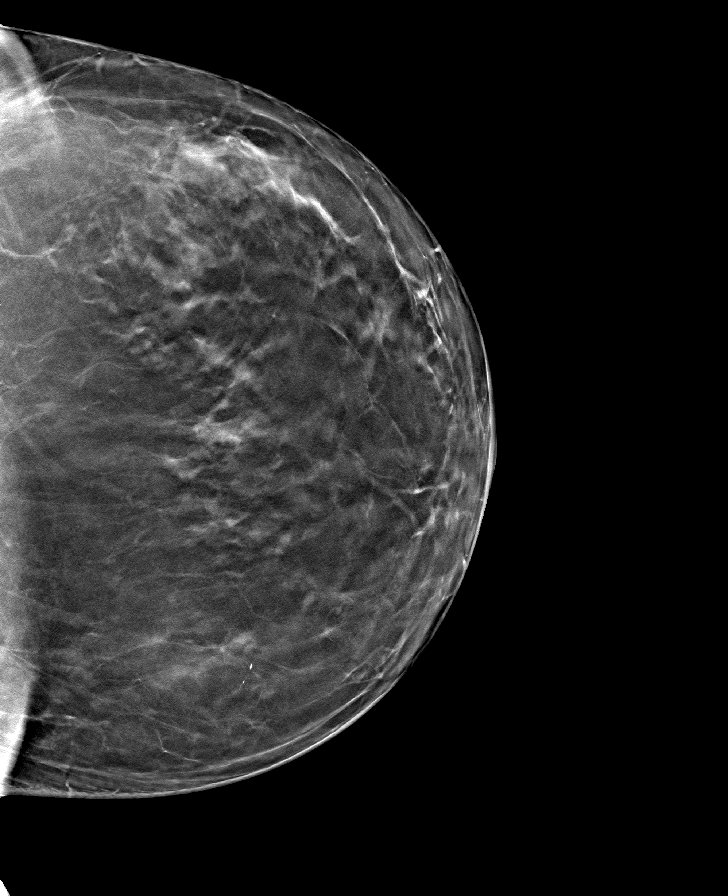

[L MLO tomo · tomo slice 49/97.0]
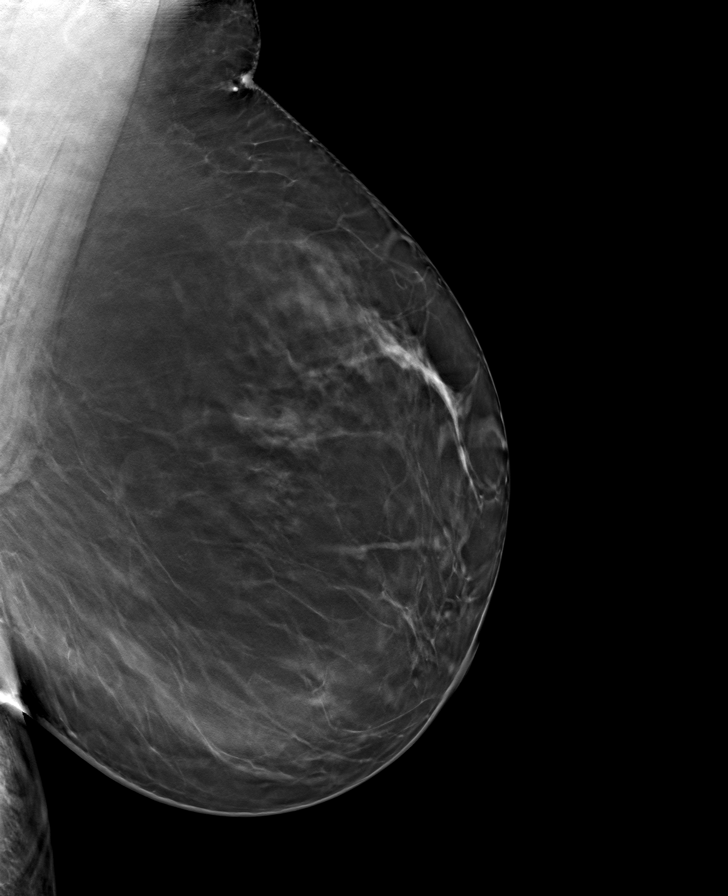

[8 of 24 positions shown; findings below may reference images not displayed]

ACR Breast Density Category b: There are scattered areas of
fibroglandular density.
FINDINGS: There are no findings suspicious for malignancy.
IMPRESSION: No mammographic evidence of malignancy. A result letter of this
screening mammogram will be mailed directly to the patient.

RECOMMENDATION:
Screening mammogram in one year. (Code:51-O-LD2)

BI-RADS CATEGORY  1: Negative.

## 2022-05-27 ENCOUNTER — Ambulatory Visit
Admission: RE | Admit: 2022-05-27 | Discharge: 2022-05-27 | Disposition: A | Discharge status: Home / Self Care | Payer: PRIVATE HEALTH INSURANCE | Source: Ambulatory Visit | Attending: Obstetrics & Gynecology | Admitting: Obstetrics & Gynecology

## 2022-05-27 DIAGNOSIS — Z1231 Encounter for screening mammogram for malignant neoplasm of breast: Secondary | ICD-10-CM

## 2022-06-02 ENCOUNTER — Encounter: Payer: Self-pay | Admitting: Family Medicine

## 2022-06-02 ENCOUNTER — Ambulatory Visit (INDEPENDENT_AMBULATORY_CARE_PROVIDER_SITE_OTHER): Payer: PRIVATE HEALTH INSURANCE | Admitting: Family Medicine

## 2022-06-02 VITALS — BP 130/70 | HR 69 | Temp 97.2°F | Ht 64.0 in | Wt 216.0 lb

## 2022-06-02 DIAGNOSIS — E039 Hypothyroidism, unspecified: Secondary | ICD-10-CM | POA: Diagnosis not present

## 2022-06-02 DIAGNOSIS — M654 Radial styloid tenosynovitis [de Quervain]: Secondary | ICD-10-CM | POA: Diagnosis not present

## 2022-06-02 DIAGNOSIS — M67432 Ganglion, left wrist: Secondary | ICD-10-CM

## 2022-06-02 NOTE — Assessment & Plan Note (Signed)
Resolved after steroid injection.

## 2022-06-02 NOTE — Progress Notes (Signed)
[facility] [address] [place] [address] Dept: [phone] Dept Fax: [phone]  Chronic Care Office Visit  Subjective:    Patient ID: Tracey Patel, female    DOB: 1956/02/07, 67 y.o..   MRN: [id number]  Chief Complaint  Patient presents with   Follow-up    6 month f/u.  Not fasting today.  No concerns.      History of Present Illness:  Patient is in today for reassessment of chronic medical issues.  Ms. Moger has a history of hypothyroidism since she was 67 years old. She is currently manage don levothyroxine 175 mcg daily.   At her last visit, I diagnosed Ms. Berrie with [place]is Must Quervain's tenosynovitis of the left wrist. I referred her to Dr. Raeford Razor (sports medicine). He gave her a steroid injection. She noted significant improvement, though she had a wrist sprain about a month later. Since that time, she has had a cyst appear over the radial aspect of the left wrist. This is not tender and she is not greatly concerned with this at present.  Past Medical History: Patient Active Problem List   Diagnosis Date Noted   Ganglion cyst of dorsum of left wrist 06/02/2022   Wrist sprain, left, initial encounter 01/17/2022   De Quervain's tenosynovitis, left 12/07/2021   Hypothyroidism 02/07/2018   Obesity 02/07/2018   Past Surgical History:  Procedure Laterality Date   BREAST CYST EXCISION Right 06/15/1985   CHOLECYSTECTOMY  1995   COLONOSCOPY  12/2020   repeat in 7 yrs   LAPAROSCOPIC HYSTERECTOMY  2001   POLYPECTOMY     [place]   Family History  Problem Relation Age of Onset   Heart disease Mother    Diabetes Mother    Atrial fibrillation Mother    Obesity Mother    Barrett's esophagus Mother    Hypertension Father    Atrial fibrillation Father    Gout Father    Barrett's esophagus Father    Dementia Father    Cancer Sister        Uterine   Cancer Brother        Leukemia   Atrial  fibrillation Maternal Grandmother    Heart disease Maternal Grandmother    Cancer Maternal Grandfather        Esophageal   Esophageal cancer Maternal Grandfather    Cancer Paternal Grandmother        Uterine   Heart disease Paternal Grandfather    Colon cancer Neg Hx    Pancreatic cancer Neg Hx    Rectal cancer Neg Hx    Stomach cancer Neg Hx    Colon polyps Neg Hx    Breast cancer Neg Hx    Outpatient Medications Prior to Visit  Medication Sig Dispense Refill   levothyroxine (SYNTHROID) 175 MCG tablet Take 1 tablet (175 mcg total) by mouth daily before breakfast. 90 tablet 3   No facility-administered medications prior to visit.   No Known Allergies    Objective:   Today's Vitals   06/02/22 0814  BP: 130/70  Pulse: 69  Temp: (!) 97.2 F (36.2 C)  TempSrc: Temporal  SpO2: 97%  Weight: 216 lb (98 kg)  Height: '5\' 4"'$  (1.626 m)   Body mass index is 37.08 kg/m.   General: Well developed, well nourished. No acute distress. Extremities: Full ROM of left wrist. There is a cystic mass overlying the radial aspect fo the wrist, measuring about   2  cm. No tenderness.  Psych: Alert and oriented. Normal mood and affect.  Health Maintenance Due  Topic Date Due   HIV Screening  Never done   Hepatitis C Screening  Never done   DTaP/Tdap/Td (1 - Tdap) Never done   Zoster Vaccines- Shingrix (1 of 2) Never done   PAP SMEAR-Modifier  07/22/2022     Assessment & Plan:   Problem List Items Addressed This Visit       Endocrine   Hypothyroidism - Primary    Doing well. We will check a TSH today. Continue levothyroxine 175 mcg daily.      Relevant Orders   TSH     Musculoskeletal and Integument   De Quervain's tenosynovitis, left    Resolved after steroid injection.        Other   Ganglion cyst of dorsum of left wrist    Discussed benign nature of this. Should she chose to have an intervention, I would plan a referral to a hand surgeon.       Return for Welcome to  Medicare visit after June.   Haydee Salter, MD

## 2022-06-02 NOTE — Assessment & Plan Note (Signed)
Doing well. We will check a TSH today. Continue levothyroxine 175 mcg daily.

## 2022-06-02 NOTE — Addendum Note (Signed)
Addended by: Lynda Rainwater on: 06/02/2022 10:23 AM   Modules accepted: Orders

## 2022-06-02 NOTE — Assessment & Plan Note (Signed)
Discussed benign nature of this. Should she chose to have an intervention, I would plan a referral to a hand surgeon.

## 2022-06-06 NOTE — Addendum Note (Signed)
Addended by: Adora Fridge on: 06/06/2022 08:28 AM   Modules accepted: Orders

## 2022-06-07 ENCOUNTER — Other Ambulatory Visit (INDEPENDENT_AMBULATORY_CARE_PROVIDER_SITE_OTHER): Payer: PRIVATE HEALTH INSURANCE

## 2022-06-07 DIAGNOSIS — E039 Hypothyroidism, unspecified: Secondary | ICD-10-CM

## 2022-06-07 LAB — TSH: TSH: 2.48 u[IU]/mL (ref 0.35–5.50)

## 2022-08-15 ENCOUNTER — Encounter: Payer: Self-pay | Admitting: *Deleted

## 2022-10-31 DIAGNOSIS — Z6838 Body mass index (BMI) 38.0-38.9, adult: Secondary | ICD-10-CM | POA: Diagnosis not present

## 2022-10-31 DIAGNOSIS — Z01419 Encounter for gynecological examination (general) (routine) without abnormal findings: Secondary | ICD-10-CM | POA: Diagnosis not present

## 2022-11-29 ENCOUNTER — Telehealth: Payer: Self-pay

## 2022-11-29 NOTE — Telephone Encounter (Signed)
Pt is scheduled for OV on Mon 12/06/22 with [facility], Appt Note stating [facility]. LVM for pt to RS appt.  This is a appt and needs a NV scheduled prior for EKG, hearing, and vision screenings.

## 2022-11-30 ENCOUNTER — Encounter (INDEPENDENT_AMBULATORY_CARE_PROVIDER_SITE_OTHER): Payer: Self-pay

## 2022-12-05 ENCOUNTER — Ambulatory Visit: Payer: PRIVATE HEALTH INSURANCE | Admitting: Family Medicine

## 2022-12-13 ENCOUNTER — Encounter: Payer: Self-pay | Admitting: Family Medicine

## 2022-12-13 ENCOUNTER — Ambulatory Visit: Payer: PRIVATE HEALTH INSURANCE

## 2022-12-13 ENCOUNTER — Ambulatory Visit (INDEPENDENT_AMBULATORY_CARE_PROVIDER_SITE_OTHER): Payer: Medicare Other | Admitting: Family Medicine

## 2022-12-13 VITALS — BP 130/70 | HR 70 | Temp 97.7°F | Ht 64.0 in | Wt 201.4 lb

## 2022-12-13 DIAGNOSIS — Z13220 Encounter for screening for lipoid disorders: Secondary | ICD-10-CM

## 2022-12-13 DIAGNOSIS — Z23 Encounter for immunization: Secondary | ICD-10-CM

## 2022-12-13 DIAGNOSIS — Z13820 Encounter for screening for osteoporosis: Secondary | ICD-10-CM

## 2022-12-13 DIAGNOSIS — E6609 Other obesity due to excess calories: Secondary | ICD-10-CM

## 2022-12-13 DIAGNOSIS — E66811 Other obesity due to excess calories: Secondary | ICD-10-CM

## 2022-12-13 DIAGNOSIS — Z0000 Encounter for general adult medical examination without abnormal findings: Secondary | ICD-10-CM

## 2022-12-13 DIAGNOSIS — Z6834 Body mass index (BMI) 34.0-34.9, adult: Secondary | ICD-10-CM

## 2022-12-13 DIAGNOSIS — Z780 Asymptomatic menopausal state: Secondary | ICD-10-CM

## 2022-12-13 DIAGNOSIS — E039 Hypothyroidism, unspecified: Secondary | ICD-10-CM

## 2022-12-13 MED ORDER — LEVOTHYROXINE SODIUM 175 MCG PO TABS: 175.0000 ug | ORAL_TABLET | Freq: Every day | ORAL | 3 refills | Status: DC

## 2022-12-13 NOTE — Patient Instructions (Signed)
Lipid screening today. Recommend you check with your pharmacy regarding tetanus and shingles vaccination. Pneumococcal vaccine today. Referral for bone density.

## 2022-12-13 NOTE — Progress Notes (Signed)
[facility] [address] [place] [address] Dept: [phone] Dept Fax: [phone]  Welcome to Medicare Visit  Subjective:    Patient ID: Tracey Patel, female    DOB: 07-May-1955, 67 y.o..   MRN: [id number]   Chief Complaint  Patient presents with   Medicare Wellness    Welcome to medicare visit.  C/o weight and Tinnitus     Patient presents today for their Welcome to Medicare Exam   Preventive Screening-Counseling & Management  Hearing Screening  Method: Audiometry   500Hz  1000Hz  2000Hz  3000Hz  4000Hz  6000Hz  8000Hz   Right ear Pass Pass Pass Pass Pass Pass Pass  Left ear Pass Pass Pass Pass Pass Pass Pass   Vision Screening   Right eye Left eye Both eyes  Without correction     With correction 20/25 20/25 20/20    Advanced Directives: Yes- Husband is HCR, brother is alternate  Modifiable Risk Factors/behavioral risk assessment/psychosocial risk assessment  Regular exercise: Walking 3 miles 4-5 times a week. [employer]- water aerobics and exercise. Diet: Reduced carbs (no bread, limited pasta, high protein and vegetables  Wt Readings from Last 3 Encounters:  12/13/22 201 lb 6.4 oz (91.4 kg)  06/02/22 216 lb (98 kg)  02/23/22 214 lb 11.2 oz (97.4 kg)   Smoking Status: Smoked for 2 years. Quit 40 years ago. Second Hand Smoking status: No smokers in home  Alcohol intake: 1-2 times per week. Very limited recently.  Cardiac risk factors: Advanced age (75 for women)  Hyperlipidemia No Hypertension No No diabetes.  Lab Results  Component Value Date   HGBA1C 5.6 07/22/2019   Family History:  Family History  Problem Relation Age of Onset   Heart disease Mother    Diabetes Mother    Atrial fibrillation Mother    Obesity Mother    Barrett's esophagus Mother    Hypertension Father    Atrial fibrillation Father    Gout Father    Barrett's esophagus Father    Dementia Father    Cancer Sister        Uterine    Cancer Brother        Leukemia   Atrial fibrillation Maternal Grandmother    Heart disease Maternal Grandmother    Cancer Maternal Grandfather        Esophageal   Esophageal cancer Maternal Grandfather    Cancer Paternal Grandmother        Uterine   Heart disease Paternal Grandfather    Colon cancer Neg Hx    Pancreatic cancer Neg Hx    Rectal cancer Neg Hx    Stomach cancer Neg Hx    Colon polyps Neg Hx    Breast cancer Neg Hx    Depression Screen/risk evaluation     12/13/2022    2:07 PM 02/23/2022    7:55 AM 11/30/2021    8:16 AM 02/07/2018    8:42 AM  Depression screen PHQ 2/9  Decreased Interest 0 0 0 0  Down, Depressed, Hopeless 0 0 0 0  PHQ - 2 Score 0 0 0 0    Functional ability and level of safety Mobility assessment: 5 sec timed get up and go (<12 seconds)  Activities of Daily Living- Independent in ADLs (toileting, bathing, dressing, transferring, eating) and in IADLs (shopping, housekeeping, managing own medications, and handling finances)   Home Safety: Loose rugs (Yes), smoke detectors (Yes), small pets (No), grab bars (No), stairs (Yes), life-alert system (No)  Hearing Difficulties: No  Fall Risk: None     12/13/2022    2:07 PM 12/09/2022    2:20 PM 06/02/2022    8:18 AM 02/23/2022    7:55 AM 11/30/2021    8:16 AM  Fall Risk   Falls in the past year? 1 1 0 0 1  Number falls in past yr: 0 0 0  0  Injury with Fall? 0 0 0  0  Risk for fall due to : No Fall Risks  No Fall Risks  No Fall Risks  Follow up Falls evaluation completed  Falls evaluation completed  Falls evaluation completed    Opioid use history: No long term opioids use  Self assessment of health status: Very good health  Required Immunizations needed today:    Immunization History  Administered Date(s) Administered   Influenza-Unspecified 02/02/2015, 01/22/2020, 01/24/2022   PFIZER(Purple Top)SARS-COV-2 Vaccination 07/17/2019, 08/07/2019, 03/24/2020, 08/10/2020   Pfizer Covid Bivalent  Pediatric Vaccine(80mos to <43yrs) 01/26/2021    Health Maintenance  Topic Date Due   HIV Screening  Never done   Hepatitis C Screening  Never done   DTaP/Tdap/Td vaccine (1 - Tdap) Never done   Zoster (Shingles) Vaccine (1 of 2) Never done   DEXA scan (bone density measurement)  Never done   Pneumonia Vaccine (1 of 1 - PCV) 11/27/2022   COVID-19 Vaccine (6 - 2023-24 season) 12/29/2022*   Flu Shot  01/06/2023*   Medicare Annual Wellness Visit  12/13/2023   Mammogram  05/27/2024   Colon Cancer Screening  02/06/2027   HPV Vaccine  Aged Out   Pap Smear  Discontinued  *Topic was postponed. The date shown is not the original due date.   Screening tests- Health Maintenance Due  Topic Date Due   HIV Screening  Never done   Hepatitis C Screening  Never done   DTaP/Tdap/Td (1 - Tdap) Never done   Zoster Vaccines- Shingrix (1 of 2) Never done   DEXA SCAN  Never done   Pneumonia Vaccine 55+ Years old (1 of 1 - PCV) 11/27/2022   1.      Colon cancer screening- UTD 2.      Lung Cancer screening- Not indicated 3.      Skin cancer screening- Has appointment with dermatology 4.      Cervical cancer screening- No longer needed- hysterectomy 5.      Breast cancer screening- UTD  The following were reviewed and entered/updated in Epic: Past Medical History:  Diagnosis Date   Cataract    forming - small    Thyroid disease    hypothyroidism   Patient Active Problem List   Diagnosis Date Noted   Ganglion cyst of dorsum of left wrist 06/02/2022   Wrist sprain, left, initial encounter 01/17/2022   De Quervain's tenosynovitis, left 12/07/2021   Hypothyroidism 02/07/2018   Obesity 02/07/2018   Past Surgical History:  Procedure Laterality Date   BREAST CYST EXCISION Right 06/15/1985   CHOLECYSTECTOMY  1995   COLONOSCOPY  12/2020   repeat in 7 yrs   LAPAROSCOPIC HYSTERECTOMY  2001   POLYPECTOMY     TUBAL LIGATION  1991   Family History  Problem Relation Age of Onset   Heart disease  Mother    Diabetes Mother    Atrial fibrillation Mother    Obesity Mother    Barrett's esophagus Mother    Hypertension Father    Atrial fibrillation Father    Gout Father    Barrett's esophagus Father  Dementia Father    Cancer Sister        Uterine   Cancer Brother        Leukemia   Atrial fibrillation Maternal Grandmother    Heart disease Maternal Grandmother    Cancer Maternal Grandfather        Esophageal   Esophageal cancer Maternal Grandfather    Cancer Paternal Grandmother        Uterine   Heart disease Paternal Grandfather    Colon cancer Neg Hx    Pancreatic cancer Neg Hx    Rectal cancer Neg Hx    Stomach cancer Neg Hx    Colon polyps Neg Hx    Breast cancer Neg Hx    Medications- reviewed and updated Current Outpatient Medications  Medication Sig Dispense Refill   levothyroxine (SYNTHROID) 175 MCG tablet Take 1 tablet (175 mcg total) by mouth daily before breakfast. 90 tablet 3   No current facility-administered medications for this visit.   Allergies-reviewed and updated No Known Allergies  Social History   Socioeconomic History   Marital status: Married    Spouse name: Not on file   Number of children: 0   Years of education: Not on file   Highest education level: Bachelor's degree (e.g., BA, AB, BS)  Occupational History   Occupation: Retired- Charity fundraiser  Tobacco Use   Smoking status: Former    Current packs/day: 0.00    Types: Cigarettes    Quit date: 1986    Years since quitting: 38.6   Smokeless tobacco: Never   Tobacco comments:    Smoked less than pack for 2 years  Vaping Use   Vaping status: Never Used  Substance and Sexual Activity   Alcohol use: Yes    Alcohol/week: 4.0 - 5.0 standard drinks of alcohol    Types: 4 - 5 Glasses of wine per week    Comment: socially   Drug use: No   Sexual activity: Yes    Partners: Male  Other Topics Concern   Not on file  Social History Narrative   Not on file   Social Determinants of Health    Financial Resource Strain: Low Risk  (12/13/2022)   Overall Financial Resource Strain (CARDIA)    Difficulty of Paying Living Expenses: Not hard at all  Food Insecurity: No Food Insecurity (12/13/2022)   Hunger Vital Sign    Worried About Running Out of Food in the Last Year: Never true    Ran Out of Food in the Last Year: Never true  Transportation Needs: No Transportation Needs (12/13/2022)   PRAPARE - Administrator, Civil Service (Medical): No    Lack of Transportation (Non-Medical): No  Physical Activity: Sufficiently Active (12/13/2022)   Exercise Vital Sign    Days of Exercise per Week: 5 days    Minutes of Exercise per Session: 50 min  Stress: No Stress Concern Present (12/13/2022)   [employer] of Occupational Health - Occupational Stress Questionnaire    Feeling of Stress : Not at all  Social Connections: Moderately Isolated (12/13/2022)   Social Connection and Isolation Panel [NHANES]    Frequency of Communication with Friends and Family: More than three times a week    Frequency of Social Gatherings with Friends and Family: More than three times a week    Attends Religious Services: Never    Database administrator or Organizations: No    Attends Banker Meetings: Never    Marital Status: Married  Objective:    Vitals:   12/13/22 1357  Weight: 201 lb 6.4 oz (91.4 kg)  Height: 5\' 4"  (1.626 m)   General: Well developed, well nourished. No acute distress. HEENT: Normocephalic, non-traumatic. PERRL, EOMI. Conjunctiva clear. External ears normal. EAC and TMs normal   bilaterally. Nose clear without congestion or rhinorrhea. Mucous membranes moist. Oropharynx clear. Good dentition. Neck: Supple. No lymphadenopathy. No thyromegaly. Lungs: Clear to auscultation bilaterally. No wheezing, rales or rhonchi. CV: RRR without murmurs or rubs. Pulses 2+ bilaterally. Abdomen: Soft, non-tender. Bowel sounds positive, normal pitch and frequency. No  hepatosplenomegaly. No rebound   or guarding. Back: Straight. No CVA tenderness bilaterally. Extremities: Full ROM. No joint swelling or tenderness. No edema noted. Skin: Warm and dry. No rashes. Psych: Alert and oriented x3. Normal mood and affect.    Assessment & Plan:   Welcome to Medicare exam completed- 1.      Educated, counseled and referred based on above elements 2.      Educated, counseled and referred as appropriate for preventative needs 3.      Discussed and documented a written plan for preventiative services and screenings with personalized health advice- After Visit Summary was given to patient which included this plan  Lipid screening today. Recommend you check with your pharmacy regarding tetanus and shingles vaccination. Pneumococcal vaccine today. Referral for bone density.  4. EKG completed  Problem List Items Addressed This Visit       Endocrine   Hypothyroidism   Relevant Medications   levothyroxine (SYNTHROID) 175 MCG tablet   Other Relevant Orders   TSH     Other   Class 1 obesity due to excess calories with body mass index (BMI) of 34.0 to 34.9 in adult   Other Visit Diagnoses     Welcome to Medicare preventive visit    -  Primary   Relevant Orders   EKG 12-Lead (Completed)   Screening for lipid disorders       Relevant Orders   Lipid panel   Postmenopausal       Relevant Orders   DG Bone Density   Screening for osteoporosis       Relevant Orders   DG Bone Density   Need for pneumococcal 20-valent conjugate vaccination       Relevant Orders   Pneumococcal conjugate vaccine 20-valent      Status of chronic or acute concerns We will reassess TSH today.  Recommended follow up: 1 year   Lab/Order associations:   ICD-10-CM   1. Welcome to Medicare preventive visit  Z00.00 EKG 12-Lead    2. Hypothyroidism, unspecified type  E03.9 levothyroxine (SYNTHROID) 175 MCG tablet    TSH    3. Class 1 obesity due to excess calories without  serious comorbidity with body mass index (BMI) of 34.0 to 34.9 in adult  E66.09    Z68.34     4. Screening for lipid disorders  Z13.220 Lipid panel    5. Postmenopausal  Z78.0 DG Bone Density    6. Screening for osteoporosis  Z13.820 DG Bone Density    7. Need for pneumococcal 20-valent conjugate vaccination  Z23 Pneumococcal conjugate vaccine 20-valent     Meds ordered this encounter  Medications   levothyroxine (SYNTHROID) 175 MCG tablet    Sig: Take 1 tablet (175 mcg total) by mouth daily before breakfast.    Dispense:  90 tablet    Refill:  3    Loyola Mast, MD

## 2022-12-22 NOTE — Telephone Encounter (Signed)
WTM appt completed 12/13/22.

## 2023-01-30 DIAGNOSIS — R6882 Decreased libido: Secondary | ICD-10-CM | POA: Diagnosis not present

## 2023-01-30 DIAGNOSIS — Z789 Other specified health status: Secondary | ICD-10-CM | POA: Diagnosis not present

## 2023-01-30 DIAGNOSIS — D225 Melanocytic nevi of trunk: Secondary | ICD-10-CM | POA: Diagnosis not present

## 2023-01-30 DIAGNOSIS — L820 Inflamed seborrheic keratosis: Secondary | ICD-10-CM | POA: Diagnosis not present

## 2023-01-30 DIAGNOSIS — L814 Other melanin hyperpigmentation: Secondary | ICD-10-CM | POA: Diagnosis not present

## 2023-01-30 DIAGNOSIS — L538 Other specified erythematous conditions: Secondary | ICD-10-CM | POA: Diagnosis not present

## 2023-01-30 DIAGNOSIS — L821 Other seborrheic keratosis: Secondary | ICD-10-CM | POA: Diagnosis not present

## 2023-01-30 DIAGNOSIS — L298 Other pruritus: Secondary | ICD-10-CM | POA: Diagnosis not present

## 2023-04-18 ENCOUNTER — Other Ambulatory Visit: Payer: Self-pay | Admitting: Family Medicine

## 2023-04-18 DIAGNOSIS — Z1231 Encounter for screening mammogram for malignant neoplasm of breast: Secondary | ICD-10-CM

## 2023-06-01 ENCOUNTER — Ambulatory Visit
Admission: RE | Admit: 2023-06-01 | Discharge: 2023-06-01 | Disposition: A | Discharge status: Home / Self Care | Payer: Medicare Other | Source: Ambulatory Visit | Attending: Family Medicine | Admitting: Family Medicine

## 2023-06-01 DIAGNOSIS — Z1231 Encounter for screening mammogram for malignant neoplasm of breast: Secondary | ICD-10-CM | POA: Diagnosis not present

## 2023-10-09 ENCOUNTER — Ambulatory Visit (HOSPITAL_BASED_OUTPATIENT_CLINIC_OR_DEPARTMENT_OTHER)
Admission: RE | Admit: 2023-10-09 | Discharge: 2023-10-09 | Disposition: A | Discharge status: Home / Self Care | Source: Ambulatory Visit | Attending: Family Medicine | Admitting: Radiology

## 2023-10-09 DIAGNOSIS — Z780 Asymptomatic menopausal state: Secondary | ICD-10-CM | POA: Diagnosis not present

## 2023-10-09 DIAGNOSIS — Z13820 Encounter for screening for osteoporosis: Secondary | ICD-10-CM | POA: Diagnosis not present

## 2023-10-10 ENCOUNTER — Ambulatory Visit: Payer: Self-pay | Admitting: Family Medicine

## 2023-10-11 DIAGNOSIS — K080 Exfoliation of teeth due to systemic causes: Secondary | ICD-10-CM | POA: Diagnosis not present

## 2023-11-09 DIAGNOSIS — K080 Exfoliation of teeth due to systemic causes: Secondary | ICD-10-CM | POA: Diagnosis not present

## 2023-11-27 ENCOUNTER — Encounter: Payer: Self-pay | Admitting: Podiatry

## 2023-11-27 ENCOUNTER — Ambulatory Visit: Admitting: Podiatry

## 2023-11-27 DIAGNOSIS — L600 Ingrowing nail: Secondary | ICD-10-CM | POA: Diagnosis not present

## 2023-11-27 MED ORDER — NEOMYCIN-POLYMYXIN-HC 3.5-10000-1 OT SUSP: OTIC | 0 refills | Status: DC

## 2023-11-27 NOTE — Patient Instructions (Addendum)
 Place 1/4 cup of epsom salts in a quart of warm tap water.  Submerge your foot or feet in the solution and soak for 20 minutes.  This soak should be done twice a day.  Next, remove your foot or feet from solution, blot dry the affected area. Avoid using antibiotic ointment as this can keep the nail edge moist and slow down healing over time.  Instead, you can use 1 to 2 drops of Corticosporin as prescribed to the nail border after each foot soak.  You can allow this area to dry before applying Band-Aid.  IF YOUR SKIN BECOMES IRRITATED WHILE USING THESE INSTRUCTIONS, IT IS OKAY TO SWITCH TO  WHITE VINEGAR AND WATER.  As another alternative soak, you may use antibacterial soap and water.  Monitor for any signs/symptoms of infection. Call the office immediately if any occur or go directly to the emergency room. Call with any questions/concerns.   Continue to soak and bandage the toe until a dry scab starts to form.  Once the scab is well-formed, you can discontinue any soaking bandaging and can resume all normal activity.  Recommend waiting about a month before getting a pedicure.

## 2023-11-27 NOTE — Progress Notes (Signed)
 Subjective:  Patient ID: Tracey Patel, female    DOB: 1955/12/30,  MRN: [id number]  Tracey Patel presents to clinic today for:  Chief Complaint  Patient presents with   Ingrown Toenail    Left first nail, bilateral borders. It is a little red around the borders and painful if she pushes on it. Not diabetic and no anti coag.   Patient presenting for above complaint.  Left hallux lateral border most affected.  This been going on for close to 2 weeks at this point.  She is concerned about it getting worse and has upcoming travel and therefore would like the affected toenail removed.  PCP is Rudd, Garnette HERO, MD.  No Known Allergies  Review of Systems: Negative except as noted in the HPI.  Objective:  There were no vitals filed for this visit.  Tracey Patel is a pleasant 68 y.o. female in NAD. AAO x 3.  Vascular Examination: Capillary refill time is less than 3 seconds to toes bilateral. Palpable pedal pulses b/l LE. Digital hair present b/l. No pedal edema b/l. Skin temperature gradient WNL b/l. No varicosities b/l. No cyanosis or clubbing noted b/l.   Dermatological Examination: There is incurvation of the left hallux lateral nail border.  There is pain on palpation of the affected nail border.  Mild erythema and edema present.  No drainage.  Neurological Examination: Protective sensation intact with Semmes-Weinstein 10 gram monofilament b/l LE. Vibratory sensation intact b/l LE.      No data to display           Assessment/Plan: 1. Ingrown toenail of left foot     Meds ordered this encounter  Medications   neomycin -polymyxin-hydrocortisone (CORTISPORIN) 3.5-10000-1 OTIC suspension    Sig: Apply 1-2 drops daily after soaking and cover with bandaid    Dispense:  10 mL    Refill:  0    Discussed patient's condition today.  After obtaining patient consent, the left hallux was anesthetized with a 50:50 mixture of 1% lidocaine plain and 0.5% bupivacaine plain for  a total of 3cc's administered.  Upon confirmation of anesthesia, a freer elevator was utilized to free the left hallux lateral nail border from the nail bed.  The nail border was then avulsed proximal to the eponychium and removed in toto.  The area was inspected for any remaining spicules.  A chemical matrixectomy was performed with phenol and neutralized with isopropyl alcohol solution.  Antibiotic ointment and a DSD were applied, followed by a Coban dressing.  Patient tolerated the anesthetic and procedure well and will f/u in 2-3 weeks for recheck.  Patient given post-procedure instructions for daily 20-minute Epsom salt soaks, antibiotic Corticosporin drops and daily use of Bandaids until toe starts to dry / form eschar.   Recommend taking over-the-counter Tylenol 650 mg and ibuprofen 200 to 400 mg every 6 hours for pain as needed.  Recommend staggering medications.   Return in about 2 weeks (around 12/11/2023) for Nail Check.   Ethan Saddler, DPM, AACFAS [facility]     [address] [place], [place] [address]                Office [phone]  Fax [phone]

## 2023-11-28 DIAGNOSIS — R6882 Decreased libido: Secondary | ICD-10-CM | POA: Diagnosis not present

## 2023-11-29 ENCOUNTER — Other Ambulatory Visit (HOSPITAL_BASED_OUTPATIENT_CLINIC_OR_DEPARTMENT_OTHER): Payer: PRIVATE HEALTH INSURANCE

## 2023-12-08 ENCOUNTER — Ambulatory Visit: Admitting: Podiatry

## 2023-12-08 ENCOUNTER — Encounter: Payer: Self-pay | Admitting: Podiatry

## 2023-12-08 DIAGNOSIS — L600 Ingrowing nail: Secondary | ICD-10-CM

## 2023-12-08 DIAGNOSIS — M722 Plantar fascial fibromatosis: Secondary | ICD-10-CM

## 2023-12-08 MED ORDER — MELOXICAM 15 MG PO TABS: 15.0000 mg | ORAL_TABLET | Freq: Every day | ORAL | 0 refills | Status: AC

## 2023-12-08 NOTE — Progress Notes (Signed)
       Chief Complaint  Patient presents with   Ingrown Toenail    2 week nail check -doing really well - soaking once a day    HPI: 68 y.o. female presents today following up for PNA site recheck of left hallux lateral border.  She is doing well with this.  Denies any pain or signs of infection.  She does also report history of bilateral plantar fasciitis.  She is concerned about some soreness and some signs of recurrence.  She is also leaving the country in several weeks and is concerned about having a flareup while she is abroad.  Past Medical History:  Diagnosis Date   Cataract    forming - small    Thyroid  disease    hypothyroidism    Past Surgical History:  Procedure Laterality Date   BREAST CYST EXCISION Right 06/15/1985   CHOLECYSTECTOMY  1995   COLONOSCOPY  12/2020   repeat in 7 yrs   LAPAROSCOPIC HYSTERECTOMY  2001   POLYPECTOMY     TUBAL LIGATION  1991    No Known Allergies  ROS    Physical Exam: There were no vitals filed for this visit.  General: The patient is alert and oriented x3 in no acute distress.  Dermatology: Skin is warm, dry and supple bilateral lower extremities. Interspaces are clear of maceration and debris.  Left hallux lateral nail border PNA site healing well without signs of infection.  Eschar forming along the margin with no tenderness on palpation.  Vascular: Palpable pedal pulses bilaterally. Capillary refill within normal limits.  No appreciable edema.  No erythema or calor.  Neurological: Light touch sensation grossly intact bilateral feet.   Musculoskeletal Exam: Muscle strength 5/5 in dorsiflexion, plantarflexion, inversion, eversion.  Mild tenderness on palpation of plantar medial calcaneal tubercle right greater than left.  Decreased ankle joint dorsiflexion with knees extended bilateral.   Assessment/Plan of Care: 1. Ingrown toenail of left foot   2. Plantar fasciitis, bilateral      Meds ordered this encounter   Medications   meloxicam  (MOBIC ) 15 MG tablet    Sig: Take 1 tablet (15 mg total) by mouth daily.    Dispense:  30 tablet    Refill:  0   None  Discussed clinical findings with patient today.  # Ingrown toenail left foot - Site is healing well at this point - Okay to discontinue any soaking or bandaging. - No restrictions on activity.  # Bilateral plantar fasciitis - Some early signs of recurrence and having anticipated increased activity while she is traveling abroad.  She is concerned about a flareup - Stretching instructions discussed with patient at length.  Written instructions dispensed - Course of oral meloxicam  sent to patient's pharmacy - Emphasized importance of good supportive shoe gear.  Follow-up as needed   Novi Calia L. Lamount MAUL, AACFAS [facility]     [address] [place], [place] [address]                Office [phone]  Fax [phone]

## 2023-12-08 NOTE — Patient Instructions (Signed)

## 2023-12-11 ENCOUNTER — Other Ambulatory Visit: Payer: PRIVATE HEALTH INSURANCE

## 2024-01-08 ENCOUNTER — Telehealth: Payer: Self-pay

## 2024-01-08 NOTE — Telephone Encounter (Signed)
 MyChart message sent to patient  Copied from CRM [id number]. Topic: Clinical - Prescription Issue >> Jan 05, 2024  9:38 AM Robinson H wrote: Reason for CRM: Patient needs a prescription for the COVID vaccine sent to the [facility] pharmacy on file.  Annaliza [phone] >> Jan 05, 2024  9:42 AM Robinson H wrote: Patients are at the pharmacy now if possible, going out the country in the next week

## 2024-01-09 ENCOUNTER — Telehealth: Payer: Self-pay

## 2024-01-09 NOTE — Telephone Encounter (Signed)
 Copied from CRM [id number]. Topic: Clinical - Prescription Issue >> Jan 05, 2024  9:38 AM Robinson H wrote: Reason for CRM: Patient needs a prescription for the COVID vaccine sent to the [facility] pharmacy on file.  [name] [phone] >> Jan 09, 2024  3:18 PM Thersia BROCKS wrote: Patient called in regarding COVID prescription, would like for a nurse to give her a callback explain in dept about this  >> Jan 05, 2024  9:42 AM Robinson H wrote: Patients are at the pharmacy now if possible, going out the country in the next week

## 2024-01-09 NOTE — Telephone Encounter (Signed)
 Patient notified VIA of recommendations. Dm/cma

## 2024-01-10 ENCOUNTER — Other Ambulatory Visit: Payer: Self-pay | Admitting: Family Medicine

## 2024-01-10 DIAGNOSIS — E039 Hypothyroidism, unspecified: Secondary | ICD-10-CM

## 2024-01-11 ENCOUNTER — Encounter: Payer: Self-pay | Admitting: Family Medicine

## 2024-01-26 ENCOUNTER — Other Ambulatory Visit: Payer: Self-pay | Admitting: Family Medicine

## 2024-01-26 DIAGNOSIS — E039 Hypothyroidism, unspecified: Secondary | ICD-10-CM

## 2024-02-07 ENCOUNTER — Ambulatory Visit (INDEPENDENT_AMBULATORY_CARE_PROVIDER_SITE_OTHER): Admitting: Family Medicine

## 2024-02-07 ENCOUNTER — Encounter: Payer: Self-pay | Admitting: Family Medicine

## 2024-02-07 VITALS — BP 126/74 | HR 98 | Temp 97.6°F | Ht 64.0 in | Wt 204.2 lb

## 2024-02-07 DIAGNOSIS — Z0000 Encounter for general adult medical examination without abnormal findings: Secondary | ICD-10-CM | POA: Diagnosis not present

## 2024-02-07 DIAGNOSIS — E039 Hypothyroidism, unspecified: Secondary | ICD-10-CM

## 2024-02-07 NOTE — Assessment & Plan Note (Signed)
Doing well. We will check a TSH today. Continue levothyroxine 175 mcg daily.

## 2024-02-07 NOTE — Progress Notes (Signed)
 [facility]IMARY [facility]ARE [facility] [address] [place] [address] Dept: [phone] Dept Fax: [phone]  Annual Physical Visit  Subjective:    Patient ID: Tracey Patel, female    DOB: Apr 16, 1956, 68 y.o..   MRN: [id number]  Chief Complaint  Patient presents with   Follow-up    F/u thyroid bjorn.  ? Covid RX.    Annual Exam   History of Present Illness:  Patient is in today for an annual physical/preventative visit.  Tracey Patel has a history of hypothyroidism since she was 68 years old. She is currently manage don levothyroxine  175 mcg daily.   Is doing walking 3-4 days a week.   Review of Systems  Constitutional:  Negative for chills, diaphoresis, fever, malaise/fatigue and weight loss.  HENT:  Negative for congestion, ear pain, hearing loss, sinus pain, sore throat and tinnitus.   Eyes:  Negative for blurred vision, pain, discharge and redness.  Respiratory:  Negative for cough, shortness of breath and wheezing.   Cardiovascular:  Negative for chest pain and palpitations.  Gastrointestinal:  Negative for abdominal pain, constipation, diarrhea, heartburn, nausea and vomiting.  Musculoskeletal:  Negative for back pain, joint pain and myalgias.  Skin:  Negative for itching and rash.  Psychiatric/Behavioral:  Negative for depression. The patient is not nervous/anxious.    Past Medical History: Patient Active Problem List   Diagnosis Date Noted   Ganglion cyst of dorsum of left wrist 06/02/2022   Wrist sprain, left, initial encounter 01/17/2022   De Quervain's tenosynovitis, left 12/07/2021   Hypothyroidism 02/07/2018   Class 1 obesity due to excess calories with body mass index (BMI) of 34.0 to 34.9 in adult 02/07/2018   Past Surgical History:  Procedure Laterality Date   BREAST CYST EXCISION Right 06/15/1985   CHOLECYSTECTOMY  1995   COLONOSCOPY  12/2020   repeat in 7 yrs   LAPAROSCOPIC HYSTERECTOMY  2001   POLYPECTOMY      TUBAL LIGATION  1991   Family History  Problem Relation Age of Onset   Heart disease Mother    Diabetes Mother    Atrial fibrillation Mother    Obesity Mother    Barrett's esophagus Mother    Hypertension Father    Atrial fibrillation Father    Gout Father    Barrett's esophagus Father    Dementia Father    Cancer Sister        Uterine   Cancer Brother        Leukemia   Atrial fibrillation Maternal Grandmother    Heart disease Maternal Grandmother    Cancer Maternal Grandfather        Esophageal   Esophageal cancer Maternal Grandfather    Cancer Paternal Grandmother        Uterine   Heart disease Paternal Grandfather    Colon cancer Neg Hx    Pancreatic cancer Neg Hx    Rectal cancer Neg Hx    Stomach cancer Neg Hx    Colon polyps Neg Hx    Breast cancer Neg Hx    Outpatient Medications Prior to Visit  Medication Sig Dispense Refill   levothyroxine  (SYNTHROID ) 175 MCG tablet TAKE 1 TABLET BY MOUTH ONCE DAILY BEFORE  BREAKFAST 90 tablet 3   meloxicam  (MOBIC ) 15 MG tablet Take 1 tablet (15 mg total) by mouth daily. 30 tablet 0   testosterone cypionate (DEPO-TESTOSTERONE) 200 MG/ML injection Given:    GM     neomycin -polymyxin-hydrocortisone (CORTISPORIN) 3.5-10000-1 OTIC suspension Apply  1-2 drops daily after soaking and cover with bandaid 10 mL 0   No facility-administered medications prior to visit.   No Known Allergies Objective:   Today's Vitals   02/07/24 1454  BP: 126/74  Pulse: 98  Temp: 97.6 F (36.4 C)  TempSrc: Temporal  SpO2: 99%  Weight: 204 lb 3.2 oz (92.6 kg)  Height: 5' 4 (1.626 m)   Body mass index is 35.05 kg/m.   General: Well developed, well nourished. No acute distress. HEENT: Normocephalic, non-traumatic. PERRL, EOMI. Conjunctiva clear. External   ears normal. EAC and TMs normal bilaterally. Nose clear without congestion or   rhinorrhea. Mucous membranes moist. Oropharynx clear. Good dentition. Neck: Supple. No lymphadenopathy. No  thyromegaly. Lungs: Clear to auscultation bilaterally. No wheezing, rales or rhonchi. CV: RRR without murmurs or rubs. Pulses 2+ bilaterally. Abdomen: Soft, non-tender. Bowel sounds positive, normal pitch and frequency. No   hepatosplenomegaly. No rebound or guarding. Extremities: Full ROM. No joint swelling or tenderness. No edema noted. Skin: Warm and dry. No rashes. Psych: Alert and oriented. Normal mood and affect.  Health Maintenance Due  Topic Date Due   Hepatitis C Screening  Never done   DTaP/Tdap/Td (1 - Tdap) Never done   Zoster Vaccines- Shingrix (1 of 2) Never done   Medicare Annual Wellness (AWV)  12/13/2023     Assessment & Plan:   Problem List Items Addressed This Visit       Endocrine   Hypothyroidism   Doing well. We will check a TSH today. Continue levothyroxine  175 mcg daily.      Relevant Orders   TSH     Other   Annual physical exam - Primary   Overall health is very good. Recommend ongoing regular exercise. Discussed recommended screenings and immunizations.        Return in about 1 year (around 02/06/2025) for Annual preventative care.   Garnette CHRISTELLA Simpler, MD

## 2024-02-07 NOTE — Assessment & Plan Note (Signed)
 Overall health is very good. Recommend ongoing regular exercise. Discussed recommended screenings and immunizations.

## 2024-02-08 ENCOUNTER — Ambulatory Visit: Payer: Self-pay | Admitting: Family Medicine

## 2024-02-08 LAB — TSH: TSH: 0.52 u[IU]/mL (ref 0.35–5.50)

## 2024-02-09 ENCOUNTER — Other Ambulatory Visit (HOSPITAL_BASED_OUTPATIENT_CLINIC_OR_DEPARTMENT_OTHER): Payer: Self-pay

## 2024-02-09 MED ORDER — COMIRNATY 30 MCG/0.3ML IM SUSY
0.3000 mL | PREFILLED_SYRINGE | Freq: Once | INTRAMUSCULAR | 0 refills | Status: AC
  Filled 2024-02-09: qty 0.3, 1d supply, fill #0

## 2024-03-18 ENCOUNTER — Ambulatory Visit (INDEPENDENT_AMBULATORY_CARE_PROVIDER_SITE_OTHER)

## 2024-03-18 DIAGNOSIS — Z0000 Encounter for general adult medical examination without abnormal findings: Secondary | ICD-10-CM

## 2024-03-18 NOTE — Patient Instructions (Signed)
 Tracey Patel,  Thank you for taking the time for your Medicare Wellness Visit. I appreciate your continued commitment to your health goals. Please review the care plan we discussed, and feel free to reach out if I can assist you further.  Please note that Annual Wellness Visits do not include a physical exam. Some assessments may be limited, especially if the visit was conducted virtually. If needed, we may recommend an in-person follow-up with your provider.  Ongoing Care Seeing your primary care provider every 3 to 6 months helps us  monitor your health and provide consistent, personalized care.   Referrals If a referral was made during today's visit and you haven't received any updates within two weeks, please contact the referred provider directly to check on the status.  Recommended Screenings:  Health Maintenance  Topic Date Due   Hepatitis C Screening  Never done   DTaP/Tdap/Td vaccine (1 - Tdap) Never done   Zoster (Shingles) Vaccine (1 of 2) Never done   Medicare Annual Wellness Visit  12/13/2023   COVID-19 Vaccine (7 - 2025-26 season) 08/09/2024   Breast Cancer Screening  05/31/2025   Colon Cancer Screening  02/06/2027   Pneumococcal Vaccine for age over 52  Completed   Flu Shot  Completed   DEXA scan (bone density measurement)  Completed   Meningitis B Vaccine  Aged Out       03/18/2024    9:30 AM  Advanced Directives  Does Patient Have a Medical Advance Directive? Yes  Type of Estate Agent of [place];Living will  Does patient want to make changes to medical advance directive? No - Guardian declined  Copy of Healthcare Power of Attorney in Chart? No - copy requested    Vision: Annual vision screenings are recommended for early detection of glaucoma, cataracts, and diabetic retinopathy. These exams can also reveal signs of chronic conditions such as diabetes and high blood pressure.  Dental: Annual dental screenings help detect early signs of  oral cancer, gum disease, and other conditions linked to overall health, including heart disease and diabetes.  Please see the attached documents for additional preventive care recommendations.

## 2024-03-18 NOTE — Progress Notes (Signed)
 Chief Complaint  Patient presents with   Medicare Wellness     Subjective:   Tracey Patel is a 68 y.o. female who presents for a Medicare Annual Wellness Visit.  Allergies (verified) Patient has no known allergies.   History: Past Medical History:  Diagnosis Date   Cataract    forming - small    Thyroid  disease    hypothyroidism   Past Surgical History:  Procedure Laterality Date   BREAST CYST EXCISION Right 06/15/1985   CHOLECYSTECTOMY  1995   COLONOSCOPY  12/2020   repeat in 7 yrs   LAPAROSCOPIC HYSTERECTOMY  2001   POLYPECTOMY     TUBAL LIGATION  1991   Family History  Problem Relation Age of Onset   Heart disease Mother    Diabetes Mother    Atrial fibrillation Mother    Obesity Mother    Barrett's esophagus Mother    Hypertension Father    Atrial fibrillation Father    Gout Father    Barrett's esophagus Father    Dementia Father    Cancer Sister        Uterine   Cancer Brother        Leukemia   Atrial fibrillation Maternal Grandmother    Heart disease Maternal Grandmother    Cancer Maternal Grandfather        Esophageal   Esophageal cancer Maternal Grandfather    Cancer Paternal Grandmother        Uterine   Heart disease Paternal Grandfather    Colon cancer Neg Hx    Pancreatic cancer Neg Hx    Rectal cancer Neg Hx    Stomach cancer Neg Hx    Colon polyps Neg Hx    Breast cancer Neg Hx    Social History   Occupational History   Occupation: Retired- [employer]  Tobacco Use   Smoking status: Former    Current packs/day: 0.00    Types: Cigarettes    Quit date: 1986    Years since quitting: 39.9   Smokeless tobacco: Never   Tobacco comments:    Smoked less than pack for 2 years  Vaping Use   Vaping status: Never Used  Substance and Sexual Activity   Alcohol use: Yes    Alcohol/week: 4.0 - 5.0 standard drinks of alcohol    Types: 4 - 5 Glasses of wine per week    Comment: socially   Drug use: No   Sexual activity: Yes    Partners: Male    Tobacco Counseling Counseling given: Not Answered Tobacco comments: Smoked less than pack for 2 years  SDOH Screenings   Food Insecurity: No Food Insecurity (03/14/2024)  Housing: Low Risk  (03/14/2024)  Transportation Needs: No Transportation Needs (03/14/2024)  Utilities: Not At Risk (03/18/2024)  Alcohol Screen: Low Risk  (03/14/2024)  Depression (PHQ2-9): Low Risk  (03/18/2024)  Financial Resource Strain: Low Risk  (03/14/2024)  Physical Activity: Sufficiently Active (03/14/2024)  Social Connections: Socially Isolated (03/14/2024)  Stress: No Stress Concern Present (03/14/2024)  Tobacco Use: Medium Risk (03/18/2024)  Health Literacy: Adequate Health Literacy (03/18/2024)   See flowsheets for full screening details  Depression Screen PHQ 2 & 9 Depression Scale- Over the past 2 weeks, how often have you been bothered by any of the following problems? Little interest or pleasure in doing things: 0 Feeling down, depressed, or hopeless (PHQ Adolescent also includes...irritable): 0 PHQ-2 Total Score: 0 Trouble falling or staying asleep, or sleeping too much: 0 Feeling tired or  having little energy: 0 Poor appetite or overeating (PHQ Adolescent also includes...weight loss): 0 Feeling bad about yourself - or that you are a failure or have let yourself or your family down: 0 Trouble concentrating on things, such as reading the newspaper or watching television (PHQ Adolescent also includes...like school work): 0 Moving or speaking so slowly that other people could have noticed. Or the opposite - being so fidgety or restless that you have been moving around a lot more than usual: 0 Thoughts that you would be better off dead, or of hurting yourself in some way: 0 PHQ-9 Total Score: 0 If you checked off any problems, how difficult have these problems made it for you to do your work, take care of things at home, or get along with other people?: Not difficult at all  Depression  Treatment Depression Interventions/Treatment : EYV7-0 Score <4 Follow-up Not Indicated     Goals Addressed             This Visit's Progress    Patient Stated       03/18/2024, wants to lose a few pounds       Visit info / Clinical Intake: Medicare Wellness Visit Type:: Initial Annual Wellness Visit Persons participating in visit:: patient Medicare Wellness Visit Mode:: Telephone If telephone:: video declined Because this visit was a virtual/telehealth visit:: unable to obtan vitals due to lack of equipment If Telephone or Video please confirm:: I connected with the patient using audio enabled telemedicine application and verified that I am speaking with the correct person using two identifiers; I discussed the limitations of evaluation and management by telemedicine; The patient expressed understanding and agreed to proceed Patient Location:: home Provider Location:: office Information given by:: patient Interpreter Needed?: No Pre-visit prep was completed: yes AWV questionnaire completed by patient prior to visit?: yes Date:: 03/14/24 Living arrangements:: lives with spouse/significant other Patient's Overall Health Status Rating: excellent Typical amount of pain: none Does pain affect daily life?: no Are you currently prescribed opioids?: no  Dietary Habits and Nutritional Risks How many meals a day?: 3 Eats fruit and vegetables daily?: yes Most meals are obtained by: preparing own meals; eating out In the last 2 weeks, have you had any of the following?: none Diabetic:: no  Functional Status Activities of Daily Living (to include ambulation/medication): Independent Ambulation: Independent Medication Administration: Independent Home Management: Independent Manage your own finances?: yes Primary transportation is: driving Concerns about vision?: no *vision screening is required for WTM* Concerns about hearing?: no  Fall Screening Falls in the past year?:  0 Number of falls in past year: 0 Was there an injury with Fall?: 0 Fall Risk Category Calculator: 0 Patient Fall Risk Level: Low Fall Risk  Fall Risk Patient at Risk for Falls Due to: Medication side effect Fall risk Follow up: Falls prevention discussed; Falls evaluation completed  Home and Transportation Safety: All rugs have non-skid backing?: yes All stairs or steps have railings?: yes Grab bars in the bathtub or shower?: (!) no Have non-skid surface in bathtub or shower?: yes Good home lighting?: yes Regular seat belt use?: yes Hospital stays in the last year:: no  Cognitive Assessment Difficulty concentrating, remembering, or making decisions? : no Will 6CIT or Mini Cog be Completed: yes What year is it?: 0 points What month is it?: 0 points Give patient an address phrase to remember (5 components): [address] About what time is it?: 0 points Count backwards from 20 to 1: 0  points Say the months of the year in reverse: 0 points Repeat the address phrase from earlier: 0 points 6 CIT Score: 0 points  Advance Directives (For Healthcare) Does Patient Have a Medical Advance Directive?: Yes Does patient want to make changes to medical advance directive?: No - Guardian declined Type of Advance Directive: Healthcare Power of [place]; Living will Copy of Healthcare Power of Attorney in Chart?: No - copy requested Copy of Living Will in Chart?: No - copy requested  Reviewed/Updated  Reviewed/Updated: Reviewed All (Medical, Surgical, Family, Medications, Allergies, Care Teams, Patient Goals)        Objective:    Today's Vitals   There is no height or weight on file to calculate BMI.  Current Medications (verified) Outpatient Encounter Medications as of 03/18/2024  Medication Sig   levothyroxine  (SYNTHROID ) 175 MCG tablet TAKE 1 TABLET BY MOUTH ONCE DAILY BEFORE  BREAKFAST   meloxicam  (MOBIC ) 15 MG tablet Take 1 tablet (15 mg total) by mouth daily. (Patient  taking differently: Take 15 mg by mouth as needed.)   testosterone cypionate (DEPO-TESTOSTERONE) 200 MG/ML injection Given:    GM (Patient taking differently: as needed.)   No facility-administered encounter medications on file as of 03/18/2024.   Hearing/Vision screen Hearing Screening - Comments:: Denies hearing issues Vision Screening - Comments:: Regular eye exams, Immunizations and Health Maintenance Health Maintenance  Topic Date Due   Hepatitis C Screening  Never done   DTaP/Tdap/Td (1 - Tdap) Never done   Zoster Vaccines- Shingrix (1 of 2) Never done   COVID-19 Vaccine (7 - 2025-26 season) 08/09/2024   Medicare Annual Wellness (AWV)  03/18/2025   Mammogram  05/31/2025   Colonoscopy  02/06/2027   Pneumococcal Vaccine: 50+ Years  Completed   Influenza Vaccine  Completed   DEXA SCAN  Completed   Meningococcal B Vaccine  Aged Out        Assessment/Plan:  This is a routine wellness examination for Tracey Patel.  Patient Care Team: Thedora Garnette HERO, MD as PCP - General (Family Medicine) Rox Charleston, MD as Consulting Physician (Obstetrics and Gynecology)  I have personally reviewed and noted the following in the patient's chart:   Medical and social history Use of alcohol, tobacco or illicit drugs  Current medications and supplements including opioid prescriptions. Functional ability and status Nutritional status Physical activity Advanced directives List of other physicians Hospitalizations, surgeries, and ER visits in previous 12 months Vitals Screenings to include cognitive, depression, and falls Referrals and appointments  No orders of the defined types were placed in this encounter.  In addition, I have reviewed and discussed with patient certain preventive protocols, quality metrics, and best practice recommendations. A written personalized care plan for preventive services as well as general preventive health recommendations were provided to patient.   Tracey Patel Dawn, LPN   [id number]/[id number]   Return in 1 year (on 03/18/2025).  After Visit Summary: (MyChart) Due to this being a telephonic visit, the after visit summary with patients personalized plan was offered to patient via MyChart   Nurse Notes: Due for shingles and TDAP.

## 2024-05-10 ENCOUNTER — Other Ambulatory Visit: Payer: Self-pay | Admitting: Family Medicine

## 2024-05-10 DIAGNOSIS — Z1231 Encounter for screening mammogram for malignant neoplasm of breast: Secondary | ICD-10-CM

## 2024-06-04 ENCOUNTER — Ambulatory Visit

## 2024-06-18 ENCOUNTER — Ambulatory Visit

## 2025-02-10 ENCOUNTER — Encounter: Admitting: Family Medicine

## 2025-03-24 ENCOUNTER — Ambulatory Visit
